# Patient Record
Sex: Male | Born: 1950
Health system: Southern US, Community
[De-identification: ages and names within clinical notes are randomized; demographics above are authoritative.]

## PROBLEM LIST (undated history)

## (undated) DIAGNOSIS — F32A Depression, unspecified: Secondary | ICD-10-CM

## (undated) DIAGNOSIS — F329 Major depressive disorder, single episode, unspecified: Secondary | ICD-10-CM

## (undated) DIAGNOSIS — W3400XA Accidental discharge from unspecified firearms or gun, initial encounter: Secondary | ICD-10-CM

## (undated) DIAGNOSIS — R351 Nocturia: Secondary | ICD-10-CM

## (undated) DIAGNOSIS — C61 Malignant neoplasm of prostate: Secondary | ICD-10-CM

## (undated) DIAGNOSIS — R011 Cardiac murmur, unspecified: Secondary | ICD-10-CM

## (undated) DIAGNOSIS — M1990 Unspecified osteoarthritis, unspecified site: Secondary | ICD-10-CM

## (undated) DIAGNOSIS — T884XXA Failed or difficult intubation, initial encounter: Secondary | ICD-10-CM

## (undated) DIAGNOSIS — I10 Essential (primary) hypertension: Secondary | ICD-10-CM

## (undated) DIAGNOSIS — F1011 Alcohol abuse, in remission: Secondary | ICD-10-CM

## (undated) DIAGNOSIS — F419 Anxiety disorder, unspecified: Secondary | ICD-10-CM

## (undated) HISTORY — PX: TOTAL HIP ARTHROPLASTY: SHX124

## (undated) HISTORY — PX: EYE SURGERY: SHX253

## (undated) HISTORY — PX: BREAST SURGERY: SHX581

## (undated) HISTORY — PX: PROSTATE BIOPSY: SHX241

## (undated) HISTORY — PX: ABDOMINAL SURGERY: SHX537

---
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
## 2015-07-13 ENCOUNTER — Encounter (HOSPITAL_COMMUNITY): Payer: Self-pay | Admitting: *Deleted

## 2015-07-13 ENCOUNTER — Emergency Department (HOSPITAL_COMMUNITY)
Admission: EM | Admit: 2015-07-13 | Discharge: 2015-07-13 | Disposition: A | Discharge status: Home / Self Care | Payer: Medicaid Other | Attending: Emergency Medicine | Admitting: Emergency Medicine

## 2015-07-13 DIAGNOSIS — Y92410 Unspecified street and highway as the place of occurrence of the external cause: Secondary | ICD-10-CM | POA: Diagnosis not present

## 2015-07-13 DIAGNOSIS — Y9389 Activity, other specified: Secondary | ICD-10-CM | POA: Diagnosis not present

## 2015-07-13 DIAGNOSIS — S4991XA Unspecified injury of right shoulder and upper arm, initial encounter: Secondary | ICD-10-CM | POA: Diagnosis present

## 2015-07-13 DIAGNOSIS — Z720 Tobacco use: Secondary | ICD-10-CM | POA: Diagnosis not present

## 2015-07-13 DIAGNOSIS — S40211A Abrasion of right shoulder, initial encounter: Secondary | ICD-10-CM | POA: Insufficient documentation

## 2015-07-13 DIAGNOSIS — M25511 Pain in right shoulder: Secondary | ICD-10-CM

## 2015-07-13 DIAGNOSIS — Y999 Unspecified external cause status: Secondary | ICD-10-CM | POA: Insufficient documentation

## 2015-07-13 HISTORY — DX: Unspecified osteoarthritis, unspecified site: M19.90

## 2015-07-13 MED ORDER — NAPROXEN 500 MG PO TABS: 500.0000 mg | ORAL_TABLET | Freq: Two times a day (BID) | ORAL | Status: DC

## 2015-07-13 MED ORDER — NAPROXEN 250 MG PO TABS
500.0000 mg | ORAL_TABLET | Freq: Once | ORAL | Status: AC
  Administered 2015-07-13: 500 mg via ORAL
  Filled 2015-07-13: qty 2

## 2015-07-13 MED ORDER — METHOCARBAMOL 500 MG PO TABS
500.0000 mg | ORAL_TABLET | Freq: Once | ORAL | Status: AC
  Administered 2015-07-13: 500 mg via ORAL
  Filled 2015-07-13: qty 1

## 2015-07-13 MED ORDER — METHOCARBAMOL 500 MG PO TABS: 500.0000 mg | ORAL_TABLET | Freq: Two times a day (BID) | ORAL | Status: DC

## 2015-07-13 NOTE — Discharge Instructions (Signed)
Shoulder Pain Take naproxen for pain. Follow up with your primary care physician. The shoulder is the joint that connects your arm to your body. Muscles and band-like tissues that connect bones to muscles (tendons) hold the joint together. Shoulder pain is felt if an injury or medical problem affects one or more parts of the shoulder. HOME CARE   Put ice on the sore area.  Put ice in a plastic bag.  Place a towel between your skin and the bag.  Leave the ice on for 15-20 minutes, 03-04 times a day for the first 2 days.  Stop using cold packs if they do not help with the pain.  If you were given something to keep your shoulder from moving (sling; shoulder immobilizer), wear it as told. Only take it off to shower or bathe.  Move your arm as little as possible, but keep your hand moving to prevent puffiness (swelling).  Squeeze a soft ball or foam pad as much as possible to help prevent swelling.  Take medicine as told by your doctor. GET HELP IF:  You have progressing new pain in your arm, hand, or fingers.  Your hand or fingers get cold.  Your medicine does not help lessen your pain. GET HELP RIGHT AWAY IF:   Your arm, hand, or fingers are numb or tingling.  Your arm, hand, or fingers are puffy (swollen), painful, or turn white or blue. MAKE SURE YOU:   Understand these instructions.  Will watch your condition.  Will get help right away if you are not doing well or get worse. Document Released: 05/03/2008 Document Revised: 04/01/2014 Document Reviewed: 05/29/2012 [facility] Patient Information 2015 [place], [place]. This information is not intended to replace advice given to you by your health care provider. Make sure you discuss any questions you have with your health care provider.
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
## 2015-07-13 NOTE — ED Notes (Signed)
Declined W/C at D/C and was escorted to lobby by RN. 

## 2015-08-25 ENCOUNTER — Emergency Department (HOSPITAL_COMMUNITY): Payer: Medicaid Other

## 2015-08-25 ENCOUNTER — Encounter (HOSPITAL_COMMUNITY): Payer: Self-pay | Admitting: Emergency Medicine

## 2015-08-25 ENCOUNTER — Inpatient Hospital Stay (HOSPITAL_COMMUNITY)
Admission: EM | Admit: 2015-08-25 | Discharge: 2015-08-29 | DRG: 184 | Disposition: A | Discharge status: Home / Self Care | Payer: Medicaid Other | Attending: General Surgery | Admitting: General Surgery

## 2015-08-25 DIAGNOSIS — F17210 Nicotine dependence, cigarettes, uncomplicated: Secondary | ICD-10-CM | POA: Diagnosis present

## 2015-08-25 DIAGNOSIS — F1910 Other psychoactive substance abuse, uncomplicated: Secondary | ICD-10-CM | POA: Diagnosis present

## 2015-08-25 DIAGNOSIS — L8990 Pressure ulcer of unspecified site, unspecified stage: Secondary | ICD-10-CM | POA: Insufficient documentation

## 2015-08-25 DIAGNOSIS — S42001A Fracture of unspecified part of right clavicle, initial encounter for closed fracture: Secondary | ICD-10-CM | POA: Diagnosis present

## 2015-08-25 DIAGNOSIS — J939 Pneumothorax, unspecified: Secondary | ICD-10-CM

## 2015-08-25 DIAGNOSIS — J9811 Atelectasis: Secondary | ICD-10-CM | POA: Diagnosis present

## 2015-08-25 DIAGNOSIS — R0902 Hypoxemia: Secondary | ICD-10-CM | POA: Diagnosis not present

## 2015-08-25 DIAGNOSIS — S2231XA Fracture of one rib, right side, initial encounter for closed fracture: Secondary | ICD-10-CM

## 2015-08-25 DIAGNOSIS — F10129 Alcohol abuse with intoxication, unspecified: Secondary | ICD-10-CM | POA: Diagnosis present

## 2015-08-25 DIAGNOSIS — D649 Anemia, unspecified: Secondary | ICD-10-CM | POA: Diagnosis present

## 2015-08-25 DIAGNOSIS — R413 Other amnesia: Secondary | ICD-10-CM | POA: Diagnosis present

## 2015-08-25 DIAGNOSIS — S42031A Displaced fracture of lateral end of right clavicle, initial encounter for closed fracture: Secondary | ICD-10-CM | POA: Diagnosis present

## 2015-08-25 DIAGNOSIS — R4020 Unspecified coma: Secondary | ICD-10-CM

## 2015-08-25 DIAGNOSIS — S2241XA Multiple fractures of ribs, right side, initial encounter for closed fracture: Principal | ICD-10-CM | POA: Diagnosis present

## 2015-08-25 DIAGNOSIS — S300XXA Contusion of lower back and pelvis, initial encounter: Secondary | ICD-10-CM | POA: Diagnosis present

## 2015-08-25 DIAGNOSIS — S27321A Contusion of lung, unilateral, initial encounter: Secondary | ICD-10-CM | POA: Diagnosis present

## 2015-08-25 DIAGNOSIS — D62 Acute posthemorrhagic anemia: Secondary | ICD-10-CM | POA: Clinically undetermined

## 2015-08-25 DIAGNOSIS — Z23 Encounter for immunization: Secondary | ICD-10-CM

## 2015-08-25 DIAGNOSIS — F10929 Alcohol use, unspecified with intoxication, unspecified: Secondary | ICD-10-CM

## 2015-08-25 DIAGNOSIS — S2249XA Multiple fractures of ribs, unspecified side, initial encounter for closed fracture: Secondary | ICD-10-CM | POA: Diagnosis present

## 2015-08-25 DIAGNOSIS — R404 Transient alteration of awareness: Secondary | ICD-10-CM | POA: Diagnosis present

## 2015-08-25 DIAGNOSIS — T1490XA Injury, unspecified, initial encounter: Secondary | ICD-10-CM

## 2015-08-25 HISTORY — DX: Anxiety disorder, unspecified: F41.9

## 2015-08-25 HISTORY — DX: Major depressive disorder, single episode, unspecified: F32.9

## 2015-08-25 HISTORY — DX: Depression, unspecified: F32.A

## 2015-08-25 LAB — CBC WITH DIFFERENTIAL/PLATELET
Basophils Absolute: 0 10*3/uL (ref 0.0–0.1)
Basophils Relative: 0 %
Eosinophils Absolute: 0 10*3/uL (ref 0.0–0.7)
Eosinophils Relative: 0 %
HCT: 37.5 % — ABNORMAL LOW (ref 39.0–52.0)
Hemoglobin: 11.8 g/dL — ABNORMAL LOW (ref 13.0–17.0)
Lymphocytes Relative: 12 %
Lymphs Abs: 1.1 10*3/uL (ref 0.7–4.0)
MCH: 24.8 pg — ABNORMAL LOW (ref 26.0–34.0)
MCHC: 31.5 g/dL (ref 30.0–36.0)
MCV: 78.8 fL (ref 78.0–100.0)
Monocytes Absolute: 0.8 10*3/uL (ref 0.1–1.0)
Monocytes Relative: 8 %
Neutro Abs: 7.6 10*3/uL (ref 1.7–7.7)
Neutrophils Relative %: 80 %
Platelets: 303 10*3/uL (ref 150–400)
RBC: 4.76 MIL/uL (ref 4.22–5.81)
RDW: 15.5 % (ref 11.5–15.5)
WBC: 9.6 10*3/uL (ref 4.0–10.5)

## 2015-08-25 LAB — COMPREHENSIVE METABOLIC PANEL
ALT: 19 U/L (ref 17–63)
AST: 46 U/L — ABNORMAL HIGH (ref 15–41)
Albumin: 3.4 g/dL — ABNORMAL LOW (ref 3.5–5.0)
Alkaline Phosphatase: 72 U/L (ref 38–126)
Anion gap: 9 (ref 5–15)
BUN: 9 mg/dL (ref 6–20)
CO2: 23 mmol/L (ref 22–32)
Calcium: 8.8 mg/dL — ABNORMAL LOW (ref 8.9–10.3)
Chloride: 106 mmol/L (ref 101–111)
Creatinine, Ser: 0.67 mg/dL (ref 0.61–1.24)
GFR calc Af Amer: >60 mL/min (ref >60)
GFR calc non Af Amer: >60 mL/min (ref >60)
Glucose, Bld: 82 mg/dL (ref 65–99)
Potassium: 3.5 mmol/L (ref 3.5–5.1)
Sodium: 138 mmol/L (ref 135–145)
Total Bilirubin: 0.3 mg/dL (ref 0.3–1.2)
Total Protein: 6.8 g/dL (ref 6.5–8.1)

## 2015-08-25 LAB — LIPASE, BLOOD: Lipase: 30 U/L (ref 22–51)

## 2015-08-25 LAB — I-STAT CG4 LACTIC ACID, ED
Lactic Acid, Venous: 2.56 mmol/L (ref 0.5–2.0)
Lactic Acid, Venous: 1.89 mmol/L (ref 0.5–2.0)

## 2015-08-25 LAB — ETHANOL: Alcohol, Ethyl (B): 128 mg/dL — ABNORMAL HIGH (ref <5)

## 2015-08-25 LAB — CK: Total CK: 500 U/L — ABNORMAL HIGH (ref 49–397)

## 2015-08-25 MED ORDER — ONDANSETRON HCL 4 MG/2ML IJ SOLN: 4.0000 mg | Freq: Four times a day (QID) | INTRAMUSCULAR | Status: DC | PRN

## 2015-08-25 MED ORDER — HYDROMORPHONE HCL 1 MG/ML IJ SOLN
0.5000 mg | INTRAMUSCULAR | Status: DC | PRN
Start: 2015-08-25 — End: 2015-08-26

## 2015-08-25 MED ORDER — OXYCODONE HCL 5 MG PO TABS
10.0000 mg | ORAL_TABLET | ORAL | Status: DC | PRN
  Administered 2015-08-26: 10 mg via ORAL
  Filled 2015-08-25: qty 2

## 2015-08-25 MED ORDER — OXYCODONE HCL 5 MG PO TABS: 2.5000 mg | ORAL_TABLET | ORAL | Status: DC | PRN

## 2015-08-25 MED ORDER — HYDROMORPHONE HCL 1 MG/ML IJ SOLN
1.0000 mg | Freq: Once | INTRAMUSCULAR | Status: AC
  Administered 2015-08-25: 1 mg via INTRAVENOUS
  Filled 2015-08-25: qty 1

## 2015-08-25 MED ORDER — IOHEXOL 300 MG/ML  SOLN
80.0000 mL | Freq: Once | INTRAMUSCULAR | Status: AC | PRN
  Administered 2015-08-25: 100 mL via INTRAVENOUS

## 2015-08-25 MED ORDER — HYDROMORPHONE HCL 1 MG/ML IJ SOLN: 1.0000 mg | INTRAMUSCULAR | Status: DC | PRN

## 2015-08-25 MED ORDER — INFLUENZA VAC SPLIT QUAD 0.5 ML IM SUSY
0.5000 mL | PREFILLED_SYRINGE | INTRAMUSCULAR | Status: AC
  Administered 2015-08-26: 0.5 mL via INTRAMUSCULAR
  Filled 2015-08-25: qty 0.5

## 2015-08-25 MED ORDER — ONDANSETRON HCL 4 MG PO TABS: 4.0000 mg | ORAL_TABLET | Freq: Four times a day (QID) | ORAL | Status: DC | PRN

## 2015-08-25 MED ORDER — OXYCODONE HCL 5 MG PO TABS: 5.0000 mg | ORAL_TABLET | ORAL | Status: DC | PRN

## 2015-08-25 MED ORDER — MORPHINE SULFATE (PF) 4 MG/ML IV SOLN: 4.0000 mg | Freq: Once | INTRAVENOUS | Status: DC

## 2015-08-25 MED ORDER — HYDROMORPHONE HCL 1 MG/ML IJ SOLN
1.0000 mg | INTRAMUSCULAR | Status: DC | PRN
  Administered 2015-08-25 – 2015-08-26 (×3): 1 mg via INTRAVENOUS
  Filled 2015-08-25 (×3): qty 1

## 2015-08-25 MED ORDER — TETANUS-DIPHTH-ACELL PERTUSSIS 5-2.5-18.5 LF-MCG/0.5 IM SUSP
0.5000 mL | Freq: Once | INTRAMUSCULAR | Status: AC
  Administered 2015-08-25: 0.5 mL via INTRAMUSCULAR
  Filled 2015-08-25: qty 0.5

## 2015-08-25 MED ORDER — POTASSIUM CHLORIDE IN NACL 20-0.9 MEQ/L-% IV SOLN
INTRAVENOUS | Status: DC
  Administered 2015-08-25: 23:00:00 via INTRAVENOUS
  Filled 2015-08-25: qty 1000

## 2015-08-25 MED ORDER — ENOXAPARIN SODIUM 40 MG/0.4ML ~~LOC~~ SOLN: 40.0000 mg | SUBCUTANEOUS | Status: DC

## 2015-08-25 MED ORDER — SODIUM CHLORIDE 0.9 % IV BOLUS (SEPSIS)
1000.0000 mL | Freq: Once | INTRAVENOUS | Status: AC
  Administered 2015-08-25: 1000 mL via INTRAVENOUS

## 2015-08-25 MED ORDER — PNEUMOCOCCAL VAC POLYVALENT 25 MCG/0.5ML IJ INJ
0.5000 mL | INJECTION | INTRAMUSCULAR | Status: AC
  Administered 2015-08-26: 0.5 mL via INTRAMUSCULAR
  Filled 2015-08-25: qty 0.5
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
## 2015-08-25 NOTE — ED Notes (Signed)
Pt st's unable to urinate at this time

## 2015-08-25 NOTE — ED Notes (Signed)
Dr. Blackman at bedside. 

## 2015-08-25 NOTE — ED Notes (Signed)
Pt returned from CT and x-ray.  Pt remains alert and oriented.  C/O right rib pain.

## 2015-08-25 NOTE — ED Notes (Signed)
Pt's 02 sats 88%-89%.  Pt placed on 02 at 2LPM via nasal cannula
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
## 2015-08-25 NOTE — ED Notes (Signed)
Pt made aware of bed assignment 
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
## 2015-08-26 ENCOUNTER — Inpatient Hospital Stay (HOSPITAL_COMMUNITY): Payer: Medicaid Other

## 2015-08-26 DIAGNOSIS — L8990 Pressure ulcer of unspecified site, unspecified stage: Secondary | ICD-10-CM | POA: Insufficient documentation

## 2015-08-26 DIAGNOSIS — S42001A Fracture of unspecified part of right clavicle, initial encounter for closed fracture: Secondary | ICD-10-CM | POA: Diagnosis present

## 2015-08-26 DIAGNOSIS — D649 Anemia, unspecified: Secondary | ICD-10-CM | POA: Diagnosis present

## 2015-08-26 DIAGNOSIS — D62 Acute posthemorrhagic anemia: Secondary | ICD-10-CM | POA: Clinically undetermined

## 2015-08-26 DIAGNOSIS — F1910 Other psychoactive substance abuse, uncomplicated: Secondary | ICD-10-CM | POA: Diagnosis present

## 2015-08-26 LAB — RAPID URINE DRUG SCREEN, HOSP PERFORMED
Amphetamines: NOT DETECTED
Barbiturates: NOT DETECTED
Benzodiazepines: NOT DETECTED
Cocaine: POSITIVE — AB
Opiates: POSITIVE — AB
Tetrahydrocannabinol: NOT DETECTED

## 2015-08-26 LAB — CBC
HCT: 33.8 % — ABNORMAL LOW (ref 39.0–52.0)
Hemoglobin: 10.9 g/dL — ABNORMAL LOW (ref 13.0–17.0)
MCH: 25.6 pg — ABNORMAL LOW (ref 26.0–34.0)
MCHC: 32.2 g/dL (ref 30.0–36.0)
MCV: 79.5 fL (ref 78.0–100.0)
Platelets: 283 10*3/uL (ref 150–400)
RBC: 4.25 MIL/uL (ref 4.22–5.81)
RDW: 15.9 % — ABNORMAL HIGH (ref 11.5–15.5)
WBC: 8.3 10*3/uL (ref 4.0–10.5)

## 2015-08-26 LAB — BASIC METABOLIC PANEL
Anion gap: 10 (ref 5–15)
BUN: 10 mg/dL (ref 6–20)
CO2: 22 mmol/L (ref 22–32)
Calcium: 8.5 mg/dL — ABNORMAL LOW (ref 8.9–10.3)
Chloride: 105 mmol/L (ref 101–111)
Creatinine, Ser: 0.7 mg/dL (ref 0.61–1.24)
GFR calc Af Amer: >60 mL/min (ref >60)
GFR calc non Af Amer: >60 mL/min (ref >60)
Glucose, Bld: 83 mg/dL (ref 65–99)
Potassium: 4 mmol/L (ref 3.5–5.1)
Sodium: 137 mmol/L (ref 135–145)

## 2015-08-26 LAB — URINALYSIS, ROUTINE W REFLEX MICROSCOPIC
Bilirubin Urine: NEGATIVE
Glucose, UA: NEGATIVE mg/dL
Hgb urine dipstick: NEGATIVE
Ketones, ur: 15 mg/dL — AB
Nitrite: NEGATIVE
Protein, ur: NEGATIVE mg/dL
Specific Gravity, Urine: 1.02 (ref 1.005–1.030)
Urobilinogen, UA: 0.2 mg/dL (ref 0.0–1.0)
pH: 5.5 (ref 5.0–8.0)

## 2015-08-26 LAB — URINE MICROSCOPIC-ADD ON

## 2015-08-26 LAB — MRSA PCR SCREENING: MRSA by PCR: NEGATIVE

## 2015-08-26 MED ORDER — POLYETHYLENE GLYCOL 3350 17 G PO PACK
17.0000 g | PACK | Freq: Every day | ORAL | Status: DC
  Administered 2015-08-26 – 2015-08-29 (×4): 17 g via ORAL
  Filled 2015-08-26 (×4): qty 1

## 2015-08-26 MED ORDER — DOCUSATE SODIUM 100 MG PO CAPS
100.0000 mg | ORAL_CAPSULE | Freq: Two times a day (BID) | ORAL | Status: DC
  Administered 2015-08-26 – 2015-08-29 (×7): 100 mg via ORAL
  Filled 2015-08-26 (×7): qty 1

## 2015-08-26 MED ORDER — NAPROXEN 250 MG PO TABS
500.0000 mg | ORAL_TABLET | Freq: Two times a day (BID) | ORAL | Status: DC
  Administered 2015-08-26 – 2015-08-29 (×7): 500 mg via ORAL
  Filled 2015-08-26 (×9): qty 2

## 2015-08-26 MED ORDER — OXYCODONE HCL 5 MG PO TABS
5.0000 mg | ORAL_TABLET | ORAL | Status: DC | PRN
  Administered 2015-08-26: 10 mg via ORAL
  Administered 2015-08-26 – 2015-08-28 (×7): 15 mg via ORAL
  Filled 2015-08-26 (×2): qty 3
  Filled 2015-08-26: qty 2
  Filled 2015-08-26 (×5): qty 3

## 2015-08-26 MED ORDER — ENOXAPARIN SODIUM 30 MG/0.3ML ~~LOC~~ SOLN
30.0000 mg | Freq: Two times a day (BID) | SUBCUTANEOUS | Status: DC
  Administered 2015-08-26 – 2015-08-29 (×7): 30 mg via SUBCUTANEOUS
  Filled 2015-08-26 (×7): qty 0.3

## 2015-08-26 NOTE — Progress Notes (Signed)
Patient ID: Michael Rosales, male   DOB: 08/08/1951, 64 y.o.   MRN: [id number]   LOS: 1 day   Subjective: Feeling all right.   Objective: Vital signs in last 24 hours: Temp:  [98.6 F (37 C)-99 F (37.2 C)] 98.6 F (37 C) (09/27 0400) Pulse Rate:  [62-86] 62 (09/27 0600) Resp:  [11-24] 11 (09/27 0600) BP: (90-132)/(54-77) 125/75 mmHg (09/27 0600) SpO2:  [85 %-99 %] 96 % (09/27 0600) FiO2 (%):  [50 %] 50 % (09/27 0313) Weight:  [90.719 kg (200 lb)-108.9 kg (240 lb 1.3 oz)] 108.9 kg (240 lb 1.3 oz) (09/26 2220)    IS: 1299ml   Laboratory  CBC  Recent Labs  08/25/15 2013 08/26/15 0259  WBC 9.6 8.3  HGB 11.8* 10.9*  HCT 37.5* 33.8*  PLT 303 283   BMET  Recent Labs  08/25/15 2013 08/26/15 0259  NA 138 137  K 3.5 4.0  CL 106 105  CO2 23 22  GLUCOSE 82 83  BUN 9 10  CREATININE 0.67 0.70  CALCIUM 8.8* 8.5*    Radiology Results PORTABLE CHEST 1 VIEW  COMPARISON: Radiographs and CT performed yesterday.  FINDINGS: Progressive volume loss in the right lower hemithorax with increased atelectasis and ill-defined basilar opacity. No definite pneumothorax. Cardiomediastinal contours are unchanged. Minimal linear atelectasis at the left lung base. No large pleural effusion. No pulmonary edema. Multiple right-sided rib fractures are again seen. Fracture with fragmentation of the distal right clavicle is unchanged.  IMPRESSION: Increased volume loss in the right lower hemithorax with increased atelectasis and ill-defined basilar opacity, likely increased contusion. No evidence of pneumothorax. Multiple right-sided rib fractures again seen.   Electronically Signed  By: Jeb Levering M.D.  On: 08/26/2015 03:42   Physical Exam General appearance: alert and no distress Resp: clear to auscultation bilaterally Cardio: regular rate and rhythm GI: normal findings: bowel sounds normal and soft, non-tender   Assessment/Plan: BCC Right clavicle fx --  Awaiting ortho consult by Dr. Lorin Mercy Multiple right rib fxs -- Pulmonary toilet Acute on chronic anemia -- Will need OP f/u PSA -- Drinks 2-3x/week FEN -- Add NSAID, increase OxyIR range, advance diet, SL IV, bowel regimen VTE -- SCD's, Lovenox (increase for weight) Dispo -- To floor, PT/OT    Lisette Abu, PA-C Pager: [phone] General Trauma PA Pager: [phone]  08/26/2015

## 2015-08-26 NOTE — Progress Notes (Signed)
SATURATION QUALIFICATIONS: (This note is used to comply with regulatory documentation for home oxygen)  Patient Saturations on Room Air at Rest = 90-91%  Patient Saturations on Room Air while Ambulating = 83-84%  Patient Saturations on 1 Liters of oxygen while at rest after ambulating = 93-94%  Please briefly explain why patient needs home oxygen: Patient requires oxygen at this time for safe ambulation to maintain sats >90%.  Roanna Epley, SPT [phone] I have read, reviewed and agree with student's note.   [place] [phone] (pager)

## 2015-08-26 NOTE — Progress Notes (Signed)
Orthopedic Tech Progress Note Patient Details:  Michael Rosales 12-03-1950 [id number]  Ortho Devices Type of Ortho Device: Arm sling Ortho Device/Splint Location: rue Ortho Device/Splint Interventions: Application   Nykerria Macconnell 08/26/2015, 8:20 AM

## 2015-08-26 NOTE — Progress Notes (Signed)
Utilization Review Completed.Remijio Holleran T9/27/2016  

## 2015-08-26 NOTE — Progress Notes (Signed)
Pt c/o SOB. States 'I can't catch my breath.' Originally on 2L. Increased to 4L [place]. 96% and RR 17-24. No accessory muscle use. States pain is 8/10. Prn dilaudid given. Pt still feels like he is not getting any oxygen. Placed on a venti mask and sats 99%, RR 24. CXR ordered for 6am. Radiology coming to perform stat. Will notify MD when resulted.   M.Forest Gleason, RN
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
## 2015-08-26 NOTE — Progress Notes (Signed)
Attempted to give report, receiving nurse is currently in the huddle.

## 2015-08-26 NOTE — Progress Notes (Signed)
Transferred to 6N per wheelchair. Pt is alert and oriented. Sling applied to right arm by Ortho Tech.

## 2015-08-26 NOTE — Progress Notes (Signed)
Patient resting comfortably at this time. 99% on venti mask. RR 17. CXR resulted and MD notified.   M.Forest Gleason, RN
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
## 2015-08-27 NOTE — Progress Notes (Signed)
Patient ID: Michael Rosales, male   DOB: 31-Jul-1951, 64 y.o.   MRN: [id number]   LOS: 2 days   Subjective: Sore this morning but otherwise good. Was hypoxic (83%) yesterday when up with PT and was symptomatic.   Objective: Vital signs in last 24 hours: Temp:  [98.1 F (36.7 C)-98.8 F (37.1 C)] 98.1 F (36.7 C) (09/28 0549) Pulse Rate:  [53-60] 58 (09/28 0549) Resp:  [17-18] 18 (09/28 0549) BP: (102-127)/(57-74) 102/63 mmHg (09/28 0549) SpO2:  [93 %-100 %] 100 % (09/28 0549) Last BM Date: 08/25/15   IS: 1221ml (=)   Physical Exam General appearance: alert and no distress Resp: clear to auscultation bilaterally Cardio: regular rate and rhythm GI: normal findings: bowel sounds normal and soft, non-tender Extremities: NVI   Assessment/Plan: BCC Right clavicle fx -- Nonop in sling per Dr. Lorin Mercy Multiple right rib fxs -- Pulmonary toilet, will need to get oxygenation better before discharge vs pulmonary consult and home with home O2 but I don't think he'll need that. Acute on chronic anemia -- Will need OP f/u PSA -- Drinks 2-3x/week FEN -- No issues VTE -- SCD's, Lovenox  Dispo -- PT/OT    Lisette Abu, PA-C Pager: [phone] General Trauma PA Pager: [phone]  08/27/2015
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
## 2015-08-28 NOTE — Progress Notes (Signed)
SATURATION QUALIFICATIONS: (This note is used to comply with regulatory documentation for home oxygen)  Patient Saturations on Room Air at Rest = 96%  Patient Saturations on Room Air while Ambulating = 87%  Patient Saturations on 2 Liters of oxygen while Ambulating = 90%  Please briefly explain why patient needs home oxygen: Currently requires supplemental oxygen to maintain SpO2 >88% while ambulating.

## 2015-08-29 ENCOUNTER — Encounter (HOSPITAL_COMMUNITY): Payer: Self-pay

## 2015-08-29 MED ORDER — OXYCODONE-ACETAMINOPHEN 7.5-325 MG PO TABS: 1.0000 | ORAL_TABLET | ORAL | Status: DC | PRN

## 2015-08-29 MED ORDER — NAPROXEN 500 MG PO TABS: 500.0000 mg | ORAL_TABLET | Freq: Two times a day (BID) | ORAL | Status: DC

## 2015-08-29 NOTE — Progress Notes (Signed)
Pt scheduled for discharge this pm. Bus pass provided by case management. Pt getting dressed for discharge
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
## 2015-08-29 NOTE — Care Management Note (Signed)
Case Management Note  Patient Details  Name: Michael Rosales MRN: [id number] Date of Birth: [birth year]  Subjective/Objective:                    Action/Plan:  Explained MATCH letter to patient 14 days pain medication $3 . Bus pass given . Patient stated he understands  Expected Discharge Date:                  Expected Discharge Plan:  Home/Self Care  In-House Referral:     Discharge planning Services  CM Consult, Wake Program, Medication Assistance  Post Acute Care Choice:    Choice offered to:     DME Arranged:    DME Agency:     HH Arranged:    HH Agency:     Status of Service:  Completed, signed off  Medicare Important Message Given:    Date Medicare IM Given:    Medicare IM give by:    Date Additional Medicare IM Given:    Additional Medicare Important Message give by:     If discussed at [place] of Stay Meetings, dates discussed:    Additional Comments:  [name], RN 08/29/2015, 10:48 AM
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
## 2015-08-29 NOTE — Progress Notes (Signed)
Pt states that he last had a bowel movement 4 days ago despite being on daily miralax and colace 100mg  po twice a day

## 2015-08-29 NOTE — Progress Notes (Signed)
Pt discharged home in stable condition. Discharge instructions provided with pt verbalizing understanding the instructions. No issues of concern voiced

## 2015-11-16 ENCOUNTER — Encounter (HOSPITAL_COMMUNITY): Payer: Self-pay | Admitting: Emergency Medicine

## 2015-11-16 ENCOUNTER — Emergency Department (HOSPITAL_COMMUNITY): Payer: Medicaid Other

## 2015-11-16 ENCOUNTER — Emergency Department (HOSPITAL_COMMUNITY)
Admission: EM | Admit: 2015-11-16 | Discharge: 2015-11-16 | Disposition: A | Discharge status: Home / Self Care | Payer: Medicaid Other | Attending: Emergency Medicine | Admitting: Emergency Medicine

## 2015-11-16 DIAGNOSIS — S29001A Unspecified injury of muscle and tendon of front wall of thorax, initial encounter: Secondary | ICD-10-CM | POA: Diagnosis not present

## 2015-11-16 DIAGNOSIS — S0990XA Unspecified injury of head, initial encounter: Secondary | ICD-10-CM | POA: Insufficient documentation

## 2015-11-16 DIAGNOSIS — M1990 Unspecified osteoarthritis, unspecified site: Secondary | ICD-10-CM | POA: Diagnosis not present

## 2015-11-16 DIAGNOSIS — Z8659 Personal history of other mental and behavioral disorders: Secondary | ICD-10-CM | POA: Insufficient documentation

## 2015-11-16 DIAGNOSIS — Y9355 Activity, bike riding: Secondary | ICD-10-CM | POA: Insufficient documentation

## 2015-11-16 DIAGNOSIS — F17210 Nicotine dependence, cigarettes, uncomplicated: Secondary | ICD-10-CM | POA: Diagnosis not present

## 2015-11-16 DIAGNOSIS — S299XXA Unspecified injury of thorax, initial encounter: Secondary | ICD-10-CM

## 2015-11-16 DIAGNOSIS — Z791 Long term (current) use of non-steroidal anti-inflammatories (NSAID): Secondary | ICD-10-CM | POA: Diagnosis not present

## 2015-11-16 DIAGNOSIS — Y92410 Unspecified street and highway as the place of occurrence of the external cause: Secondary | ICD-10-CM | POA: Diagnosis not present

## 2015-11-16 DIAGNOSIS — Y998 Other external cause status: Secondary | ICD-10-CM | POA: Insufficient documentation

## 2015-11-16 MED ORDER — TRAMADOL HCL 50 MG PO TABS: 50.0000 mg | ORAL_TABLET | Freq: Four times a day (QID) | ORAL | Status: DC | PRN

## 2015-11-16 MED ORDER — NAPROXEN 500 MG PO TABS: 500.0000 mg | ORAL_TABLET | Freq: Two times a day (BID) | ORAL | Status: DC

## 2015-11-16 MED ORDER — ACETAMINOPHEN 325 MG PO TABS
650.0000 mg | ORAL_TABLET | Freq: Once | ORAL | Status: AC
Start: 2015-11-16 — End: 2015-11-16
  Administered 2015-11-16: 650 mg via ORAL
  Filled 2015-11-16: qty 2

## 2015-11-16 NOTE — ED Notes (Signed)
Declined W/C at D/C and was escorted to lobby by RN. 
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
## 2015-11-25 ENCOUNTER — Emergency Department (HOSPITAL_COMMUNITY)
Admission: EM | Admit: 2015-11-25 | Discharge: 2015-11-25 | Discharge status: Against Medical Advice | Payer: Medicaid Other | Attending: Emergency Medicine | Admitting: Emergency Medicine

## 2015-11-25 ENCOUNTER — Encounter (HOSPITAL_COMMUNITY): Payer: Self-pay | Admitting: Vascular Surgery

## 2015-11-25 DIAGNOSIS — Z87828 Personal history of other (healed) physical injury and trauma: Secondary | ICD-10-CM | POA: Diagnosis not present

## 2015-11-25 DIAGNOSIS — F329 Major depressive disorder, single episode, unspecified: Secondary | ICD-10-CM | POA: Insufficient documentation

## 2015-11-25 DIAGNOSIS — F419 Anxiety disorder, unspecified: Secondary | ICD-10-CM | POA: Diagnosis not present

## 2015-11-25 DIAGNOSIS — Z791 Long term (current) use of non-steroidal anti-inflammatories (NSAID): Secondary | ICD-10-CM | POA: Insufficient documentation

## 2015-11-25 DIAGNOSIS — M1990 Unspecified osteoarthritis, unspecified site: Secondary | ICD-10-CM | POA: Diagnosis not present

## 2015-11-25 DIAGNOSIS — F17210 Nicotine dependence, cigarettes, uncomplicated: Secondary | ICD-10-CM | POA: Insufficient documentation

## 2015-11-25 DIAGNOSIS — Z760 Encounter for issue of repeat prescription: Secondary | ICD-10-CM | POA: Diagnosis present
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
## 2015-11-25 NOTE — ED Notes (Signed)
Patient told PA that he was "he was getting paranoid and he had to go".   Patient left.

## 2015-11-25 NOTE — ED Notes (Signed)
See PA assessment.   Patient basically told the PA he just needs medication refill and that [facility] sent him to the ED.

## 2015-11-25 NOTE — ED Notes (Signed)
Pt reports to the ED for eval of medication refill. Pt states he is out of his Gabapentin, Trazodone, and Buspar x 2 days. Pt states he gets it from [facility] and that he needs enough to last him until 1*/10. Pt A&OX4, resp e/u, and skin warm and dry.

## 2017-06-29 ENCOUNTER — Encounter (HOSPITAL_COMMUNITY): Payer: Self-pay

## 2017-06-29 ENCOUNTER — Emergency Department (HOSPITAL_COMMUNITY): Payer: Medicaid Other

## 2017-06-29 ENCOUNTER — Emergency Department (HOSPITAL_COMMUNITY)
Admission: EM | Admit: 2017-06-29 | Discharge: 2017-06-30 | Disposition: A | Discharge status: Home / Self Care | Payer: Medicaid Other | Attending: Emergency Medicine | Admitting: Emergency Medicine

## 2017-06-29 DIAGNOSIS — R1084 Generalized abdominal pain: Secondary | ICD-10-CM | POA: Insufficient documentation

## 2017-06-29 DIAGNOSIS — R4182 Altered mental status, unspecified: Secondary | ICD-10-CM | POA: Insufficient documentation

## 2017-06-29 DIAGNOSIS — Y9389 Activity, other specified: Secondary | ICD-10-CM | POA: Diagnosis not present

## 2017-06-29 DIAGNOSIS — S40812A Abrasion of left upper arm, initial encounter: Secondary | ICD-10-CM | POA: Insufficient documentation

## 2017-06-29 DIAGNOSIS — S80811A Abrasion, right lower leg, initial encounter: Secondary | ICD-10-CM | POA: Diagnosis not present

## 2017-06-29 DIAGNOSIS — Y929 Unspecified place or not applicable: Secondary | ICD-10-CM | POA: Insufficient documentation

## 2017-06-29 DIAGNOSIS — Y999 Unspecified external cause status: Secondary | ICD-10-CM | POA: Diagnosis not present

## 2017-06-29 DIAGNOSIS — F10920 Alcohol use, unspecified with intoxication, uncomplicated: Secondary | ICD-10-CM | POA: Insufficient documentation

## 2017-06-29 DIAGNOSIS — T148XXA Other injury of unspecified body region, initial encounter: Secondary | ICD-10-CM

## 2017-06-29 DIAGNOSIS — W19XXXA Unspecified fall, initial encounter: Secondary | ICD-10-CM

## 2017-06-29 DIAGNOSIS — S80812A Abrasion, left lower leg, initial encounter: Secondary | ICD-10-CM | POA: Diagnosis not present

## 2017-06-29 DIAGNOSIS — R55 Syncope and collapse: Secondary | ICD-10-CM | POA: Diagnosis present

## 2017-06-29 LAB — URINALYSIS, ROUTINE W REFLEX MICROSCOPIC
Bacteria, UA: NONE SEEN
Bilirubin Urine: NEGATIVE
Glucose, UA: NEGATIVE mg/dL
Ketones, ur: NEGATIVE mg/dL
Leukocytes, UA: NEGATIVE
Nitrite: NEGATIVE
Protein, ur: NEGATIVE mg/dL
Specific Gravity, Urine: 1.011 (ref 1.005–1.030)
Squamous Epithelial / LPF: NONE SEEN
pH: 5 (ref 5.0–8.0)

## 2017-06-29 LAB — COMPREHENSIVE METABOLIC PANEL
ALT: 13 U/L — ABNORMAL LOW (ref 17–63)
AST: 26 U/L (ref 15–41)
Albumin: 3.5 g/dL (ref 3.5–5.0)
Alkaline Phosphatase: 56 U/L (ref 38–126)
Anion gap: 9 (ref 5–15)
BUN: 11 mg/dL (ref 6–20)
CO2: 20 mmol/L — ABNORMAL LOW (ref 22–32)
Calcium: 8.9 mg/dL (ref 8.9–10.3)
Chloride: 108 mmol/L (ref 101–111)
Creatinine, Ser: 1.13 mg/dL (ref 0.61–1.24)
GFR calc Af Amer: >60 mL/min (ref >60)
GFR calc non Af Amer: >60 mL/min (ref >60)
Glucose, Bld: 104 mg/dL — ABNORMAL HIGH (ref 65–99)
Potassium: 3.4 mmol/L — ABNORMAL LOW (ref 3.5–5.1)
Sodium: 137 mmol/L (ref 135–145)
Total Bilirubin: 0.5 mg/dL (ref 0.3–1.2)
Total Protein: 6.4 g/dL — ABNORMAL LOW (ref 6.5–8.1)

## 2017-06-29 LAB — I-STAT CG4 LACTIC ACID, ED: Lactic Acid, Venous: 2.37 mmol/L (ref 0.5–1.9)

## 2017-06-29 LAB — CBC
HCT: 34.6 % — ABNORMAL LOW (ref 39.0–52.0)
Hemoglobin: 11.2 g/dL — ABNORMAL LOW (ref 13.0–17.0)
MCH: 23.1 pg — ABNORMAL LOW (ref 26.0–34.0)
MCHC: 32.4 g/dL (ref 30.0–36.0)
MCV: 71.5 fL — ABNORMAL LOW (ref 78.0–100.0)
Platelets: 312 10*3/uL (ref 150–400)
RBC: 4.84 MIL/uL (ref 4.22–5.81)
RDW: 16 % — ABNORMAL HIGH (ref 11.5–15.5)
WBC: 6.2 10*3/uL (ref 4.0–10.5)

## 2017-06-29 LAB — SAMPLE TO BLOOD BANK

## 2017-06-29 LAB — I-STAT CHEM 8, ED
BUN: 13 mg/dL (ref 6–20)
Calcium, Ion: 1.16 mmol/L (ref 1.15–1.40)
Chloride: 106 mmol/L (ref 101–111)
Creatinine, Ser: 1.4 mg/dL — ABNORMAL HIGH (ref 0.61–1.24)
Glucose, Bld: 101 mg/dL — ABNORMAL HIGH (ref 65–99)
HCT: 38 % — ABNORMAL LOW (ref 39.0–52.0)
Hemoglobin: 12.9 g/dL — ABNORMAL LOW (ref 13.0–17.0)
Potassium: 3.4 mmol/L — ABNORMAL LOW (ref 3.5–5.1)
Sodium: 138 mmol/L (ref 135–145)
TCO2: 19 mmol/L (ref 0–100)

## 2017-06-29 LAB — ETHANOL: Alcohol, Ethyl (B): 273 mg/dL — ABNORMAL HIGH (ref <5)

## 2017-06-29 LAB — PROTIME-INR
INR: 1.01
Prothrombin Time: 13.3 seconds (ref 11.4–15.2)

## 2017-06-29 MED ORDER — IOPAMIDOL (ISOVUE-300) INJECTION 61%
INTRAVENOUS | Status: AC
  Administered 2017-06-29: 100 mL
  Filled 2017-06-29: qty 100
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
## 2017-06-29 NOTE — ED Notes (Signed)
Vital signs stable. 
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
## 2017-06-30 ENCOUNTER — Encounter (HOSPITAL_COMMUNITY): Payer: Self-pay | Admitting: Vascular Surgery

## 2017-06-30 MED ORDER — IBUPROFEN 800 MG PO TABS
800.0000 mg | ORAL_TABLET | Freq: Once | ORAL | Status: AC
  Administered 2017-06-30: 800 mg via ORAL
  Filled 2017-06-30: qty 1

## 2017-06-30 NOTE — ED Notes (Signed)
Attempted to walk patient unable to walk.

## 2017-06-30 NOTE — ED Provider Notes (Signed)
  Physical Exam  BP 109/71 (BP Location: Right Arm)   Pulse 66   Temp 98.3 F (36.8 C) (Oral)   Resp 20   Ht 5\' 10"  (1.778 m)   Wt 74.8 kg (165 lb)   SpO2 95%   BMI 23.68 kg/m   Physical Exam  ED Course  Procedures  MDM  Patient is clinically sober. He is talking coherently, gait is normal (he has arthritis, but walked for [place] w/o needing assistance), and is demonstrating rational thought process. We shall discharge him shortly, and we have discussed the warning signs of alcohol withdrawal with him verbally, and the information will be provided with the discharge instructions as well.        Varney Biles, MD 06/30/17 [phone]

## 2017-06-30 NOTE — ED Notes (Signed)
Ambulated patient with assistance of RN and MD .

## 2017-06-30 NOTE — ED Notes (Signed)
Ambulated pt in hallway, pt unsteady on feet, pt buckling at the knees, states he needs his ibuprofen due to his arthritis. MD aware, meal given.

## 2018-01-01 ENCOUNTER — Emergency Department (HOSPITAL_COMMUNITY): Payer: Medicaid Other

## 2018-01-01 ENCOUNTER — Inpatient Hospital Stay (HOSPITAL_COMMUNITY)
Admission: EM | Admit: 2018-01-01 | Discharge: 2018-01-04 | DRG: 563 | Disposition: A | Discharge status: Home / Self Care | Payer: Medicaid Other | Attending: General Surgery | Admitting: General Surgery

## 2018-01-01 ENCOUNTER — Other Ambulatory Visit: Payer: Self-pay

## 2018-01-01 ENCOUNTER — Encounter (HOSPITAL_COMMUNITY): Payer: Self-pay | Admitting: Neurological Surgery

## 2018-01-01 DIAGNOSIS — F10129 Alcohol abuse with intoxication, unspecified: Secondary | ICD-10-CM | POA: Diagnosis present

## 2018-01-01 DIAGNOSIS — M25461 Effusion, right knee: Secondary | ICD-10-CM | POA: Diagnosis present

## 2018-01-01 DIAGNOSIS — S61011A Laceration without foreign body of right thumb without damage to nail, initial encounter: Secondary | ICD-10-CM | POA: Diagnosis present

## 2018-01-01 DIAGNOSIS — Y907 Blood alcohol level of 200-239 mg/100 ml: Secondary | ICD-10-CM | POA: Diagnosis present

## 2018-01-01 DIAGNOSIS — S62521A Displaced fracture of distal phalanx of right thumb, initial encounter for closed fracture: Principal | ICD-10-CM | POA: Diagnosis present

## 2018-01-01 DIAGNOSIS — Y9355 Activity, bike riding: Secondary | ICD-10-CM

## 2018-01-01 DIAGNOSIS — S129XXA Fracture of neck, unspecified, initial encounter: Secondary | ICD-10-CM | POA: Diagnosis present

## 2018-01-01 DIAGNOSIS — S12600A Unspecified displaced fracture of seventh cervical vertebra, initial encounter for closed fracture: Secondary | ICD-10-CM | POA: Diagnosis present

## 2018-01-01 LAB — I-STAT CHEM 8, ED
BUN: 12 mg/dL (ref 6–20)
Calcium, Ion: 1.19 mmol/L (ref 1.15–1.40)
Chloride: 109 mmol/L (ref 101–111)
Creatinine, Ser: 1 mg/dL (ref 0.61–1.24)
Glucose, Bld: 91 mg/dL (ref 65–99)
HCT: 36 % — ABNORMAL LOW (ref 39.0–52.0)
Hemoglobin: 12.2 g/dL — ABNORMAL LOW (ref 13.0–17.0)
Potassium: 3.6 mmol/L (ref 3.5–5.1)
Sodium: 143 mmol/L (ref 135–145)
TCO2: 21 mmol/L — ABNORMAL LOW (ref 22–32)

## 2018-01-01 LAB — CBC WITH DIFFERENTIAL/PLATELET
Basophils Absolute: 0 10*3/uL (ref 0.0–0.1)
Basophils Relative: 0 %
Eosinophils Absolute: 0 10*3/uL (ref 0.0–0.7)
Eosinophils Relative: 0 %
HCT: 33.6 % — ABNORMAL LOW (ref 39.0–52.0)
Hemoglobin: 10.7 g/dL — ABNORMAL LOW (ref 13.0–17.0)
Lymphocytes Relative: 45 %
Lymphs Abs: 2.1 10*3/uL (ref 0.7–4.0)
MCH: 23.5 pg — ABNORMAL LOW (ref 26.0–34.0)
MCHC: 31.8 g/dL (ref 30.0–36.0)
MCV: 73.7 fL — ABNORMAL LOW (ref 78.0–100.0)
Monocytes Absolute: 0.4 10*3/uL (ref 0.1–1.0)
Monocytes Relative: 8 %
Neutro Abs: 2.2 10*3/uL (ref 1.7–7.7)
Neutrophils Relative %: 47 %
Platelets: 328 10*3/uL (ref 150–400)
RBC: 4.56 MIL/uL (ref 4.22–5.81)
RDW: 16.8 % — ABNORMAL HIGH (ref 11.5–15.5)
WBC: 4.6 10*3/uL (ref 4.0–10.5)

## 2018-01-01 LAB — COMPREHENSIVE METABOLIC PANEL
ALT: 14 U/L — ABNORMAL LOW (ref 17–63)
AST: 29 U/L (ref 15–41)
Albumin: 3.6 g/dL (ref 3.5–5.0)
Alkaline Phosphatase: 52 U/L (ref 38–126)
Anion gap: 10 (ref 5–15)
BUN: 12 mg/dL (ref 6–20)
CO2: 20 mmol/L — ABNORMAL LOW (ref 22–32)
Calcium: 9 mg/dL (ref 8.9–10.3)
Chloride: 111 mmol/L (ref 101–111)
Creatinine, Ser: 0.9 mg/dL (ref 0.61–1.24)
GFR calc Af Amer: >60 mL/min (ref >60)
GFR calc non Af Amer: >60 mL/min (ref >60)
Glucose, Bld: 96 mg/dL (ref 65–99)
Potassium: 3.7 mmol/L (ref 3.5–5.1)
Sodium: 141 mmol/L (ref 135–145)
Total Bilirubin: 0.4 mg/dL (ref 0.3–1.2)
Total Protein: 6.7 g/dL (ref 6.5–8.1)

## 2018-01-01 LAB — I-STAT CG4 LACTIC ACID, ED: Lactic Acid, Venous: 3.27 mmol/L (ref 0.5–1.9)

## 2018-01-01 LAB — ABO/RH: ABO/RH(D): O POS

## 2018-01-01 LAB — TYPE AND SCREEN
ABO/RH(D): O POS
Antibody Screen: NEGATIVE

## 2018-01-01 LAB — PROTIME-INR
INR: 0.98
Prothrombin Time: 12.9 seconds (ref 11.4–15.2)

## 2018-01-01 LAB — ETHANOL: Alcohol, Ethyl (B): 224 mg/dL — ABNORMAL HIGH (ref <10)

## 2018-01-01 MED ORDER — FENTANYL CITRATE (PF) 100 MCG/2ML IJ SOLN
50.0000 ug | Freq: Once | INTRAMUSCULAR | Status: AC
  Administered 2018-01-01: 50 ug via INTRAVENOUS
  Filled 2018-01-01: qty 2

## 2018-01-01 MED ORDER — TETANUS-DIPHTH-ACELL PERTUSSIS 5-2.5-18.5 LF-MCG/0.5 IM SUSP
0.5000 mL | Freq: Once | INTRAMUSCULAR | Status: AC
  Administered 2018-01-01: 0.5 mL via INTRAMUSCULAR
  Filled 2018-01-01: qty 0.5

## 2018-01-01 MED ORDER — SODIUM CHLORIDE 0.9 % IV BOLUS (SEPSIS)
1000.0000 mL | Freq: Once | INTRAVENOUS | Status: AC
  Administered 2018-01-01: 1000 mL via INTRAVENOUS

## 2018-01-01 MED ORDER — CEFAZOLIN SODIUM-DEXTROSE 1-4 GM/50ML-% IV SOLN
1.0000 g | Freq: Once | INTRAVENOUS | Status: AC
  Administered 2018-01-02: 1 g via INTRAVENOUS
  Filled 2018-01-01: qty 50

## 2018-01-01 MED ORDER — FENTANYL CITRATE (PF) 100 MCG/2ML IJ SOLN
50.0000 ug | Freq: Once | INTRAMUSCULAR | Status: AC
  Administered 2018-01-02: 50 ug via INTRAVENOUS
  Filled 2018-01-01: qty 2

## 2018-01-01 MED ORDER — LIDOCAINE HCL (PF) 1 % IJ SOLN
30.0000 mL | Freq: Once | INTRAMUSCULAR | Status: AC
  Administered 2018-01-01: 30 mL
  Filled 2018-01-01: qty 30

## 2018-01-01 MED ORDER — IOPAMIDOL (ISOVUE-300) INJECTION 61%
INTRAVENOUS | Status: AC
  Administered 2018-01-01: 100 mL
  Filled 2018-01-01: qty 100
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
## 2018-01-01 NOTE — ED Notes (Signed)
Dr Ellender Hose given a copy of lactic acid 3.27

## 2018-01-02 ENCOUNTER — Observation Stay (HOSPITAL_COMMUNITY): Payer: Medicaid Other

## 2018-01-02 DIAGNOSIS — Y9355 Activity, bike riding: Secondary | ICD-10-CM | POA: Diagnosis not present

## 2018-01-02 DIAGNOSIS — S61011A Laceration without foreign body of right thumb without damage to nail, initial encounter: Secondary | ICD-10-CM | POA: Diagnosis present

## 2018-01-02 DIAGNOSIS — F10120 Alcohol abuse with intoxication, uncomplicated: Secondary | ICD-10-CM | POA: Diagnosis not present

## 2018-01-02 DIAGNOSIS — Y907 Blood alcohol level of 200-239 mg/100 ml: Secondary | ICD-10-CM | POA: Diagnosis present

## 2018-01-02 DIAGNOSIS — F10129 Alcohol abuse with intoxication, unspecified: Secondary | ICD-10-CM | POA: Diagnosis present

## 2018-01-02 DIAGNOSIS — S129XXA Fracture of neck, unspecified, initial encounter: Secondary | ICD-10-CM | POA: Diagnosis present

## 2018-01-02 DIAGNOSIS — S62521A Displaced fracture of distal phalanx of right thumb, initial encounter for closed fracture: Secondary | ICD-10-CM | POA: Diagnosis present

## 2018-01-02 DIAGNOSIS — M25461 Effusion, right knee: Secondary | ICD-10-CM | POA: Diagnosis present

## 2018-01-02 DIAGNOSIS — S12600A Unspecified displaced fracture of seventh cervical vertebra, initial encounter for closed fracture: Secondary | ICD-10-CM | POA: Diagnosis present

## 2018-01-02 LAB — URINALYSIS, ROUTINE W REFLEX MICROSCOPIC
Bilirubin Urine: NEGATIVE
Glucose, UA: NEGATIVE mg/dL
Hgb urine dipstick: NEGATIVE
Ketones, ur: NEGATIVE mg/dL
Leukocytes, UA: NEGATIVE
Nitrite: NEGATIVE
Protein, ur: NEGATIVE mg/dL
Specific Gravity, Urine: 1.033 — ABNORMAL HIGH (ref 1.005–1.030)
pH: 6 (ref 5.0–8.0)

## 2018-01-02 MED ORDER — OXYCODONE HCL 5 MG PO TABS
5.0000 mg | ORAL_TABLET | ORAL | Status: DC | PRN
  Administered 2018-01-02: 5 mg via ORAL
  Filled 2018-01-02: qty 1

## 2018-01-02 MED ORDER — ENOXAPARIN SODIUM 40 MG/0.4ML ~~LOC~~ SOLN
40.0000 mg | Freq: Every day | SUBCUTANEOUS | Status: DC
  Administered 2018-01-02 – 2018-01-04 (×3): 40 mg via SUBCUTANEOUS
  Filled 2018-01-02 (×3): qty 0.4

## 2018-01-02 MED ORDER — ONDANSETRON HCL 4 MG/2ML IJ SOLN: 4.0000 mg | Freq: Four times a day (QID) | INTRAMUSCULAR | Status: DC | PRN

## 2018-01-02 MED ORDER — CEFAZOLIN SODIUM-DEXTROSE 1-4 GM/50ML-% IV SOLN
1.0000 g | Freq: Three times a day (TID) | INTRAVENOUS | Status: AC
  Administered 2018-01-02 (×3): 1 g via INTRAVENOUS
  Filled 2018-01-02 (×3): qty 50

## 2018-01-02 MED ORDER — OXYCODONE HCL 5 MG PO TABS
10.0000 mg | ORAL_TABLET | ORAL | Status: DC | PRN
  Administered 2018-01-02 – 2018-01-03 (×4): 10 mg via ORAL
  Filled 2018-01-02 (×4): qty 2

## 2018-01-02 MED ORDER — HYDROMORPHONE HCL 1 MG/ML IJ SOLN: 1.0000 mg | INTRAMUSCULAR | Status: DC | PRN

## 2018-01-02 MED ORDER — ONDANSETRON 4 MG PO TBDP: 4.0000 mg | ORAL_TABLET | Freq: Four times a day (QID) | ORAL | Status: DC | PRN
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
## 2018-01-02 NOTE — Progress Notes (Signed)
Arrived from ED. Alert and oriented. C/O of pain 8/10 rt thumb. Rt thumb dressing and splint CDI. Aspen collar in placed. Call light within reach.

## 2018-01-02 NOTE — ED Notes (Signed)
Patient switched to aspen collar

## 2018-01-02 NOTE — Progress Notes (Signed)
Orthopedic Tech Progress Note Patient Details:  Michael Rosales 07-16-1951 [id number]  Ortho Devices Type of Ortho Device: Finger splint Ortho Device/Splint Location: rue thumb Ortho Device/Splint Interventions: Ordered, Application, Adjustment   Post Interventions Patient Tolerated: Well Instructions Provided: Care of device, Adjustment of device   Karolee Stamps 01/02/2018, 1:52 AM

## 2018-01-03 LAB — HIV ANTIBODY (ROUTINE TESTING W REFLEX): HIV Screen 4th Generation wRfx: NONREACTIVE

## 2018-01-03 LAB — CDS SEROLOGY

## 2018-01-03 MED ORDER — CEPHALEXIN 500 MG PO CAPS: 500.0000 mg | ORAL_CAPSULE | Freq: Four times a day (QID) | ORAL | 0 refills | Status: AC

## 2018-01-03 MED ORDER — VITAMIN B-1 100 MG PO TABS
100.0000 mg | ORAL_TABLET | Freq: Every day | ORAL | Status: DC
  Administered 2018-01-03 – 2018-01-04 (×2): 100 mg via ORAL
  Filled 2018-01-03 (×2): qty 1

## 2018-01-03 MED ORDER — ADULT MULTIVITAMIN W/MINERALS CH
1.0000 | ORAL_TABLET | Freq: Every day | ORAL | Status: DC
  Administered 2018-01-03 – 2018-01-04 (×2): 1 via ORAL
  Filled 2018-01-03 (×2): qty 1

## 2018-01-03 MED ORDER — FOLIC ACID 1 MG PO TABS
1.0000 mg | ORAL_TABLET | Freq: Every day | ORAL | Status: DC
  Administered 2018-01-03 – 2018-01-04 (×2): 1 mg via ORAL
  Filled 2018-01-03 (×2): qty 1

## 2018-01-03 MED ORDER — ACETAMINOPHEN 325 MG PO TABS
650.0000 mg | ORAL_TABLET | Freq: Four times a day (QID) | ORAL | Status: DC
  Administered 2018-01-03 – 2018-01-04 (×6): 650 mg via ORAL
  Filled 2018-01-03 (×6): qty 2

## 2018-01-03 MED ORDER — LORAZEPAM 2 MG/ML IJ SOLN: 2.0000 mg | INTRAMUSCULAR | Status: DC | PRN

## 2018-01-03 MED ORDER — LORAZEPAM 1 MG PO TABS: 2.0000 mg | ORAL_TABLET | ORAL | Status: DC | PRN

## 2018-01-03 MED ORDER — HYDROMORPHONE HCL 1 MG/ML IJ SOLN: 1.0000 mg | INTRAMUSCULAR | Status: DC | PRN

## 2018-01-03 MED ORDER — DOCUSATE SODIUM 100 MG PO CAPS
100.0000 mg | ORAL_CAPSULE | Freq: Every day | ORAL | Status: DC
  Administered 2018-01-03 – 2018-01-04 (×2): 100 mg via ORAL
  Filled 2018-01-03 (×2): qty 1
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
## 2018-01-03 NOTE — Progress Notes (Signed)
Pt.'s heart rate on monitor noted to be in the 40s. Assessed patient and he stated he was sleeping. Pulse 50s now, SB on monitor. Noted SB on admission to ER. [place], [place] notified of heart rate. No further orders received. Will monitor.
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
## 2018-01-03 NOTE — Progress Notes (Signed)
Orthopedic progress note:  Images and thumb viewed again by Dr. Alain Marion.   Continue plan for non-operative management right thumb fracture and local wound care.  NWB Right hand.   Elevate.   Smoking cessation. Plan for him to take Keflex for 2 weeks.  We would like to see him in the office on Friday 01/06/18.
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
## 2018-01-04 MED ORDER — CEPHALEXIN 500 MG PO CAPS
500.0000 mg | ORAL_CAPSULE | Freq: Two times a day (BID) | ORAL | Status: DC
  Administered 2018-01-04: 500 mg via ORAL
  Filled 2018-01-04: qty 1

## 2018-01-04 MED ORDER — INFLUENZA VAC SPLIT QUAD 0.5 ML IM SUSY: 0.5000 mL | PREFILLED_SYRINGE | INTRAMUSCULAR | Status: DC

## 2018-01-04 MED ORDER — IBUPROFEN 200 MG PO TABS
400.0000 mg | ORAL_TABLET | Freq: Four times a day (QID) | ORAL | Status: DC | PRN
  Administered 2018-01-04: 400 mg via ORAL
  Filled 2018-01-04: qty 2

## 2018-01-04 MED ORDER — OXYCODONE HCL 5 MG PO TABS: 5.0000 mg | ORAL_TABLET | Freq: Four times a day (QID) | ORAL | 0 refills | Status: DC | PRN

## 2018-01-04 MED ORDER — POLYETHYLENE GLYCOL 3350 17 G PO PACK
17.0000 g | PACK | Freq: Every day | ORAL | Status: DC
  Filled 2018-01-04: qty 1

## 2018-01-04 MED ORDER — CEPHALEXIN 500 MG PO CAPS
500.0000 mg | ORAL_CAPSULE | Freq: Two times a day (BID) | ORAL | 0 refills | Status: DC
Start: 2018-01-04 — End: 2018-11-13
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
## 2018-01-04 NOTE — Progress Notes (Signed)
Patient ID: Michael Rosales, male   DOB: Jul 29, 1951, 67 y.o.   MRN: [id number] Subjective: Patient reports no arm pain, no real neck pain, no NTW  Objective: Vital signs in last 24 hours: Temp:  [98 F (36.7 C)-99.2 F (37.3 C)] 99.2 F (37.3 C) (02/06 1029) Pulse Rate:  [50-62] 56 (02/06 1029) Resp:  [16-18] 16 (02/06 1029) BP: (99-129)/(57-75) 129/75 (02/06 1029) SpO2:  [96 %-98 %] 97 % (02/06 1029)  Intake/Output from previous day: 02/05 0701 - 02/06 0700 In: -  Out: 1450 [Urine:1450] Intake/Output this shift: No intake/output data recorded.  Neurologic: Grossly normal, in collar  Lab Results: Lab Results  Component Value Date   WBC 4.6 01/01/2018   HGB 12.2 (L) 01/01/2018   HCT 36.0 (L) 01/01/2018   MCV 73.7 (L) 01/01/2018   PLT 328 01/01/2018   Lab Results  Component Value Date   INR 0.98 01/01/2018   BMET Lab Results  Component Value Date   NA 143 01/01/2018   K 3.6 01/01/2018   CL 109 01/01/2018   CO2 20 (L) 01/01/2018   GLUCOSE 91 01/01/2018   BUN 12 01/01/2018   CREATININE 1.00 01/01/2018   CALCIUM 9.0 01/01/2018    Studies/Results: No results found.  Assessment/Plan: Doing ok in collar, f/u with me in 2 wks for xrays  Estimated body mass index is 24.74 kg/m as calculated from the following:   Height as of this encounter: 6\' 2"  (1.88 m).   Weight as of this encounter: 87.4 kg (192 lb 10.9 oz).    LOS: 2 days    JONES,DAVID S 01/04/2018, 10:51 AM
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
## 2018-01-04 NOTE — Progress Notes (Signed)
Pt discharged at this time.  Verbalizes understanding of all discharge instructions, including follow-up appointments, and where to pick up prescriptions called in. Pt will be going back to his boarding house via bus.  He has a bus pass in hand and all belongings.

## 2018-01-06 ENCOUNTER — Encounter (HOSPITAL_COMMUNITY): Payer: Self-pay | Admitting: Vascular Surgery

## 2018-07-13 ENCOUNTER — Other Ambulatory Visit: Payer: Self-pay | Admitting: Urology

## 2018-07-13 DIAGNOSIS — R972 Elevated prostate specific antigen [PSA]: Secondary | ICD-10-CM

## 2018-07-27 ENCOUNTER — Other Ambulatory Visit: Payer: Self-pay

## 2018-08-09 ENCOUNTER — Ambulatory Visit
Admission: RE | Admit: 2018-08-09 | Discharge: 2018-08-09 | Disposition: A | Discharge status: Home / Self Care | Payer: Medicare Other | Source: Ambulatory Visit | Attending: Urology | Admitting: Urology

## 2018-08-09 DIAGNOSIS — R972 Elevated prostate specific antigen [PSA]: Secondary | ICD-10-CM

## 2018-08-09 MED ORDER — GADOBENATE DIMEGLUMINE 529 MG/ML IV SOLN
17.0000 mL | Freq: Once | INTRAVENOUS | Status: AC | PRN
  Administered 2018-08-09: 17 mL via INTRAVENOUS

## 2018-11-08 NOTE — Progress Notes (Signed)
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 A PI-RADS category 3 lesion is labeled region of interest # 1 in the left peripheral zone as detailed above. Targeting data sent to [place]. 2. Mild prostatomegaly, prostate volume nearly 52 cubic cm.  Past/Anticipated interventions by urology, if any: prostate biopsy, referral to radiation oncology for consideration of brachytherapy  Past/Anticipated interventions by medical oncology, if any: no  Weight changes, if any: no  Bowel/Bladder complaints, if any: IPSS 10. SHIM 16. Reports urinary urgency as high biggest complaint. Denies dysuria, hematuria, urinary leakage or incontinence.    Nausea/Vomiting, if any: no  Pain issues, if any:  Chronic right knee pain and arthritis.  SAFETY ISSUES:  Prior radiation? no  Pacemaker/ICD? no  Possible current pregnancy? N/A  Is the patient on methotrexate? no  Current Complaints / other details:  67 year old male. Single. Resides in [place]. No children. Father has unknown type of canccer. Current smoker since 06/29/1988; No family history of prostate cancer; He is not interested in active surveillance and would like to pursue brachytherapy if appropriate.

## 2018-11-13 ENCOUNTER — Encounter: Payer: Self-pay | Admitting: Medical Oncology

## 2018-11-13 ENCOUNTER — Other Ambulatory Visit: Payer: Self-pay

## 2018-11-13 ENCOUNTER — Encounter: Payer: Self-pay | Admitting: Radiation Oncology

## 2018-11-13 ENCOUNTER — Ambulatory Visit
Admission: RE | Admit: 2018-11-13 | Discharge: 2018-11-13 | Disposition: A | Discharge status: Home / Self Care | Payer: Medicare Other | Source: Ambulatory Visit | Attending: Radiation Oncology | Admitting: Radiation Oncology

## 2018-11-13 VITALS — BP 114/74 | HR 68 | Temp 97.9°F | Resp 18 | Ht 74.0 in | Wt 192.4 lb

## 2018-11-13 DIAGNOSIS — Z79899 Other long term (current) drug therapy: Secondary | ICD-10-CM | POA: Insufficient documentation

## 2018-11-13 DIAGNOSIS — C61 Malignant neoplasm of prostate: Secondary | ICD-10-CM

## 2018-11-13 DIAGNOSIS — F17210 Nicotine dependence, cigarettes, uncomplicated: Secondary | ICD-10-CM | POA: Diagnosis not present

## 2018-11-13 HISTORY — DX: Malignant neoplasm of prostate: C61
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
## 2018-11-13 NOTE — Progress Notes (Signed)
See progress note under physician encounter. 

## 2018-11-13 NOTE — Progress Notes (Signed)
Spoke with patient to introduce myself as the prostate nurse navigator and my role. I was unable to meet him the morning when he consulted with Dr. Tammi Klippel. He states the visit went well and all questions were answered. He is interested in brachytherapy. He met Enid Derry this morning and is aware she will be calling him with future appointments. Encouraged him to call with questions or concerns. He voiced understanding.

## 2018-11-16 ENCOUNTER — Telehealth: Payer: Self-pay | Admitting: *Deleted

## 2018-11-16 NOTE — Telephone Encounter (Signed)
CALLED AND UPDATED PATIENT REGARDING APPTS., SPOKE WITH PATIENT

## 2018-11-17 NOTE — Addendum Note (Signed)
Encounter addended by: Heywood Footman, RN on: 11/17/2018 4:52 PM  Actions taken: Actions taken from a BestPractice Advisory, Visit Navigator Flowsheet section accepted

## 2018-11-21 ENCOUNTER — Encounter: Payer: Self-pay | Admitting: *Deleted

## 2018-11-21 NOTE — Progress Notes (Signed)
[place] Psychosocial Distress Screening Clinical Social Work  Clinical Social Work was referred by distress screening protocol.  The patient scored a 8 on the Psychosocial Distress Thermometer which indicates severe distress. Clinical Social Worker contacted patient by phone to assess for distress and other psychosocial needs.  Michael Rosales reported he lives in a boarding house and rides his bicycle to appointments or to run errands.  He reported no concerns regarding previously completed distress screen.  CSW explored patients feelings and discussed CSW role.  Patient plans to contact CSW as needs or questions arise.  ONCBCN DISTRESS SCREENING 11/17/2018  Screening Type Initial Screening  Distress experienced in past week (1-10) 8  Practical problem type Housing;Transportation  Emotional problem type Depression;Nervousness/Anxiety;Isolation/feeling alone;Boredom  Physical Problem type Pain;Getting around;Loss of appetitie;Sexual problems  Physician notified of physical symptoms Yes  Referral to clinical psychology No  Referral to clinical social work Yes  Referral to financial advocate Yes  Other Says to call anytime. Resides in boarding house in [place].     Gwinda Maine, LCSW

## 2018-12-11 ENCOUNTER — Other Ambulatory Visit: Payer: Self-pay | Admitting: Urology

## 2018-12-11 ENCOUNTER — Telehealth: Payer: Self-pay | Admitting: *Deleted

## 2018-12-11 NOTE — Telephone Encounter (Signed)
Called patient to inform of implant date, no answer, unable to leave message, will call later

## 2018-12-21 ENCOUNTER — Telehealth: Payer: Self-pay | Admitting: *Deleted

## 2018-12-21 NOTE — Telephone Encounter (Signed)
Called patient to remind of pre-seed appts. For 12-22-18, spoke with patient and he is aware of these appts.

## 2018-12-22 ENCOUNTER — Encounter: Payer: Self-pay | Admitting: Medical Oncology

## 2018-12-22 ENCOUNTER — Ambulatory Visit
Admission: RE | Admit: 2018-12-22 | Discharge: 2018-12-22 | Disposition: A | Discharge status: Home / Self Care | Payer: Medicare Other | Source: Ambulatory Visit | Attending: Urology | Admitting: Urology

## 2018-12-22 ENCOUNTER — Ambulatory Visit
Admission: RE | Admit: 2018-12-22 | Discharge: 2018-12-22 | Disposition: A | Discharge status: Home / Self Care | Payer: Medicare Other | Source: Ambulatory Visit | Attending: Radiation Oncology | Admitting: Radiation Oncology

## 2018-12-22 DIAGNOSIS — Z79899 Other long term (current) drug therapy: Secondary | ICD-10-CM | POA: Insufficient documentation

## 2018-12-22 DIAGNOSIS — C61 Malignant neoplasm of prostate: Secondary | ICD-10-CM

## 2018-12-22 DIAGNOSIS — F17210 Nicotine dependence, cigarettes, uncomplicated: Secondary | ICD-10-CM | POA: Diagnosis not present

## 2018-12-22 NOTE — Progress Notes (Signed)
[facility] On[facility]         [phone] ________________________________  Name: Michael Rosales MRN: [id number]  Date: 12/22/2018  DOB: 11/16/1951  SIMULATION AND TREATMENT PLANNING NOTE PUBIC ARCH STUDY  [id number], Pcp Not In  Festus Aloe, MD  DIAGNOSIS: 68 y.o. gentleman with low risk, Stage T1c adenocarcinoma of the prostate with Gleason Score of 3+3, and PSA of 7     ICD-10-CM   1. Malignant neoplasm of prostate ([place]) C61     COMPLEX SIMULATION:  The patient presented today for evaluation for possible prostate seed implant. He was brought to the radiation planning suite and placed supine on the CT couch. A 3-dimensional image study set was obtained in upload to the planning computer. There, on each axial slice, I contoured the prostate gland. Then, using three-dimensional radiation planning tools I reconstructed the prostate in view of the structures from the transperineal needle pathway to assess for possible pubic arch interference. In doing so, I did not appreciate any pubic arch interference. Also, the patient's prostate volume was estimated based on the drawn structure. The volume was 46 cc.  Given the pubic arch appearance and prostate volume, patient remains a good candidate to proceed with prostate seed implant. Today, he freely provided informed written consent to proceed.    PLAN: The patient will undergo prostate seed implant.   ________________________________  Sheral Apley. Tammi Klippel, M.D.   This document serves as a record of services personally performed by Tyler Pita, MD. It was created on his behalf by Wilburn Mylar, a trained medical scribe. The creation of this record is based on the scribe's personal observations and the provider's statements to them. This document has been checked and approved by the attending provider.

## 2018-12-25 ENCOUNTER — Telehealth: Payer: Self-pay | Admitting: *Deleted

## 2018-12-25 NOTE — Telephone Encounter (Signed)
CALLED PATIENT TO INFORM OF APPT. FOR CHEST X-RAY AND EKG FOR 12-29-18 - ARRIVAL TIME- 9:45 AM @ [facility] ADMITTING, SPOKE WITH PATIENT AND HE IS AWARE OF THIS APPT.

## 2018-12-27 ENCOUNTER — Encounter: Payer: Self-pay | Admitting: *Deleted

## 2018-12-28 NOTE — Progress Notes (Signed)
[facility] Clinical Social Work  Clinical Social Work met with patient after SIM appointment to assess for psychosocial needs.  Patient living in boarding house that no longer has power and was informed the residence was being condemned.  Patient unable to stay in shelter and has no friends/family to reside with.  CSW applied for [facility] program on patient's behalf.  Patient is aware and plans to follow up with CSW [place] 1/31 to receive further information and discuss other housing programs.   Lauren Somers, LCSW  Clinical Social Worker [facility]         

## 2018-12-29 ENCOUNTER — Inpatient Hospital Stay: Payer: Medicare Other | Admitting: General Practice

## 2018-12-29 ENCOUNTER — Ambulatory Visit (HOSPITAL_COMMUNITY)
Admission: RE | Admit: 2018-12-29 | Discharge: 2018-12-29 | Disposition: A | Discharge status: Home / Self Care | Payer: Medicare Other | Source: Ambulatory Visit | Attending: Urology | Admitting: Urology

## 2018-12-29 ENCOUNTER — Encounter: Payer: Self-pay | Admitting: General Practice

## 2018-12-29 ENCOUNTER — Other Ambulatory Visit: Payer: Self-pay | Admitting: Urology

## 2018-12-29 ENCOUNTER — Encounter (HOSPITAL_COMMUNITY)
Admission: RE | Admit: 2018-12-29 | Discharge: 2018-12-29 | Disposition: A | Discharge status: Home / Self Care | Payer: Medicare Other | Source: Ambulatory Visit | Attending: Urology | Admitting: Urology

## 2018-12-29 DIAGNOSIS — C61 Malignant neoplasm of prostate: Secondary | ICD-10-CM

## 2018-12-29 NOTE — Progress Notes (Signed)
[facility] CSW Progress Notes  Met w patient to complete referral (VI-SPIDAT) to [facility] for consideration for help w housing for homeless individuals.  Referral sent to [facility].  Provided 31 day disability bus pass and information packet on [employer] - patient will be housed at [facility] [address] as a participant in this program.  [facility] will evaluate referral and determine if they can assist.  Edwyna Shell, [place] Worker Phone:  [phone]

## 2019-01-04 ENCOUNTER — Encounter: Payer: Self-pay | Admitting: Hospice and Palliative Medicine

## 2019-01-04 ENCOUNTER — Encounter: Payer: Self-pay | Admitting: Nurse Practitioner
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
## 2019-01-04 NOTE — Progress Notes (Signed)
This encounter was created in error - please disregard.

## 2019-01-11 ENCOUNTER — Telehealth: Payer: Self-pay
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
## 2019-01-18 ENCOUNTER — Telehealth: Payer: Self-pay | Admitting: *Deleted

## 2019-01-18 NOTE — Telephone Encounter (Signed)
CALLED PATIENT TO REMIND OF LAB APPT. FOR 01-19-19 - ARRIVAL TIME - 1:30 PM @ [facility]MITT[facility], SPOKE WITH PATIENT AND HE IS AWARE OF THIS APPT.

## 2019-01-19 ENCOUNTER — Other Ambulatory Visit: Payer: Self-pay

## 2019-01-19 ENCOUNTER — Encounter (HOSPITAL_COMMUNITY)
Admission: RE | Admit: 2019-01-19 | Discharge: 2019-01-19 | Disposition: A | Discharge status: Home / Self Care | Payer: Medicare Other | Source: Ambulatory Visit | Attending: Urology | Admitting: Urology

## 2019-01-19 ENCOUNTER — Encounter (HOSPITAL_BASED_OUTPATIENT_CLINIC_OR_DEPARTMENT_OTHER): Payer: Self-pay | Admitting: *Deleted

## 2019-01-19 DIAGNOSIS — Z01812 Encounter for preprocedural laboratory examination: Secondary | ICD-10-CM | POA: Diagnosis present

## 2019-01-19 LAB — COMPREHENSIVE METABOLIC PANEL
ALT: 15 U/L (ref 0–44)
AST: 34 U/L (ref 15–41)
Albumin: 3.6 g/dL (ref 3.5–5.0)
Alkaline Phosphatase: 53 U/L (ref 38–126)
Anion gap: 7 (ref 5–15)
BUN: 12 mg/dL (ref 8–23)
CO2: 20 mmol/L — ABNORMAL LOW (ref 22–32)
Calcium: 8.8 mg/dL — ABNORMAL LOW (ref 8.9–10.3)
Chloride: 113 mmol/L — ABNORMAL HIGH (ref 98–111)
Creatinine, Ser: 0.8 mg/dL (ref 0.61–1.24)
GFR calc Af Amer: >60 mL/min (ref >60)
GFR calc non Af Amer: >60 mL/min (ref >60)
Glucose, Bld: 111 mg/dL — ABNORMAL HIGH (ref 70–99)
Potassium: 4.4 mmol/L (ref 3.5–5.1)
Sodium: 140 mmol/L (ref 135–145)
Total Bilirubin: 0.2 mg/dL — ABNORMAL LOW (ref 0.3–1.2)
Total Protein: 7.1 g/dL (ref 6.5–8.1)

## 2019-01-19 LAB — CBC
HCT: 29.5 % — ABNORMAL LOW (ref 39.0–52.0)
Hemoglobin: 7.8 g/dL — ABNORMAL LOW (ref 13.0–17.0)
MCH: 18.8 pg — ABNORMAL LOW (ref 26.0–34.0)
MCHC: 26.4 g/dL — ABNORMAL LOW (ref 30.0–36.0)
MCV: 70.9 fL — ABNORMAL LOW (ref 80.0–100.0)
Platelets: 288 10*3/uL (ref 150–400)
RBC: 4.16 MIL/uL — ABNORMAL LOW (ref 4.22–5.81)
RDW: 20.9 % — ABNORMAL HIGH (ref 11.5–15.5)
WBC: 4.1 10*3/uL (ref 4.0–10.5)
nRBC: 0.5 % — ABNORMAL HIGH (ref 0.0–0.2)

## 2019-01-19 LAB — PROTIME-INR
INR: 1.07
Prothrombin Time: 13.8 seconds (ref 11.4–15.2)

## 2019-01-19 LAB — APTT: aPTT: 29 seconds (ref 24–36)

## 2019-01-19 NOTE — Progress Notes (Signed)
Your procedure is scheduled on : __02-28-2020  Friday_     Report to [facility] AT:  5:30  AM    Call this number if you have problems the morning of surgery  :[phone].     OUR ADDRESS IS [address].  WE ARE LOCATED [facility].      SPECIAL INSTRUCTIONS:  DO ONE FLEET ENEMA MORNING OF SURGERY                                        REMEMBER:  DO NOT EAT FOOD OR DRINK LIQUIDS AFTER MIDNIGHT .     TAKE THESE MEDICATIONS MORNING OF SURGERY WITH A SIP OF WATER:   Buspar,  Celexa,  Gabapentin                                       DO NOT WEAR JEWERLY, OR NAIL POLISH,   DO NOT WEAR LOTIONS, POWDERS, PERFUMES OR DEODORANT.  MEN MAY SHAVE FACE AND NECK.  CONTACTS, GLASSES, OR DENTURES MAY NOT BE WORN TO SURGERY.                                    [facility] IS NOT RESPONSIBLE  FOR ANY BELONGINGS.                                                                    Marland Kitchen

## 2019-01-19 NOTE — Progress Notes (Addendum)
Spoke w/ pt face to face for pre-op interview.   Npo after mn.  Arrive at 0530.  Lab work done today.  Current ekg and cxr in chart and epic.  Will take am meds w/ sips of water and to do one fleet enema morning of surgery.  Printed instructions given to pt.  Pt having issue with transportation after discharge.  Pt is working with Education officer, museum at cancer center for transportation.  Also, pt verbalized understanding the has to have a caregiver at discharge and recommended 24 hours after surgery due to anesthesia.   ADDENDUM:  Called office yesterday and left message with coni, or scheduler,  pt does not have anyone at all to stay with him after surgery and the only that could be arranged by cancer center social worker is transportation to bring to surgery and be picked up that next after overnight stay.  Received call back from office and dr eskridge has pt to stay overnight.  Called and spoke w/ Ander Purpura social worker via phone and will get transportation arranged for pt Friday and for pick up on Saturday.  Ander Purpura is going to bring the voucher pt needs for Saturday to pre-surgical testing and I will take to over to [facility] to pt.

## 2019-01-22 ENCOUNTER — Telehealth: Payer: Self-pay | Admitting: *Deleted

## 2019-01-22 NOTE — Telephone Encounter (Signed)
CSW received phone call from Jeral Fruit regarding patient's upcoming seed placement surgery.  RN notified patient must have someone with him for observation following seed placement.  CSW is unaware of possible options, as patient has no family or friend support available.   CSW contacted congregational nurse program, Lessie Dings, and discussed situation.  Congregational nursing team will follow up with CSW.  Maryjean Morn, MSW, LCSW, OSW-C Clinical Social Worker [facility] [phone]

## 2019-01-24 NOTE — H&P (Signed)
Office Visit Report     01/18/2019   --------------------------------------------------------------------------------   Michael Rosales  MRN: [id number]  PRIMARY CARE:  Kevan Ny, MD  DOB: 1951-03-30, 68 year old Male  REFERRING:  Georgette Dover, MD  SSN:  [id number]  PROVIDER:  Festus Aloe, M.D.    LOCATION:  [facility] [phone]   --------------------------------------------------------------------------------   CC/HPI: I have prostate cancer (treatment).     Low - intermediate risk prostate cancer November 2019  PSA 7 - 36 -- most recent 16  T1c 63 gram prostate  Gleason 3+3 = 6, 5% left apex  Region of interest negative 3  Staging MRI: 1.7 cm left peripheral zone lesions PI-RADS 3. Prostate was 5.8 x 4.3 x 4.3 = 55 grams. Staging negative.   SHIM = 22  AUASS = 14 - bothersome urgency. He has been taking finasteride. Postvoid was 0.   He is scheduled for brachytherapy. He has some urgency. He has mild dysuria but denies sexual activity.    ALLERGIES: No Allergies    MEDICATIONS: Finasteride 5 mg tablet 1 tablet PO Daily  Acid Reducer  Citalopram Hbr 40 mg tablet Oral  Glucosamine & Chondroitin  Ibuprofen 800 mg tablet Oral  Metronidazole 500 mg tablet 4 tablet PO once  Mirtazapine 15 mg tablet Oral  Tramadol Hcl 50 mg tablet Oral  Trazodone Hcl 150 mg tablet     GU PSH: Prostate Needle Biopsy - 10/04/2018, 2017      PSH Notes: No Surgical Problems   NON-GU PSH: Surgical Pathology, Gross And Microscopic Examination For Prostate Needle - 10/04/2018, 2017    GU PMH: Prostate Cancer - 12/08/2018, - 10/17/2018 Elevated PSA - 10/04/2018, - 08/24/2018, - 07/12/2018 (Stable), I went over his pathology report with him today. He has revealed no evidence of prostate cancer. At this point my recommendation is to continue observation every 6 months with DRE and PSA., - 09/30/2016, Prostate biopsy showed no sign of prostate cancer or atypia. I have  recommended continued surveillance with repeat DRE and PSA again in 6 months., - 08/03/2016, - 2017 (Stable), I went over his pathology report with him today which has revealed no evidence of adenocarcinoma despite his markedly elevated PSA. We discussed the need for continued monitoring of his DRE and PSA every 6 months, - 2017, - 2017, Elevated PSA, - 2017 BPH w/LUTS - 07/12/2018 Urinary Urgency - 07/12/2018 Urinary Tract Inf, Unspec site, Pyuria - 2017      PMH Notes: Elevated PSA: He was noted to have a PSA of 16.39 in 1/17. His DRE revealed subtle induration involving the lateral aspect of the right lobe but no nodularity. There is no family history of prostate cancer.     NON-GU PMH: Bacteriuria - 12/08/2018 Anxiety, Anxiety - 2017 Encounter for general adult medical examination without abnormal findings, Encounter for preventive health examination - 2017 Personal history of other diseases of the digestive system, History of esophageal reflux - 2017 Personal history of other diseases of the musculoskeletal system and connective tissue, History of arthritis - 2017 Personal history of other diseases of the nervous system and sense organs, History of sleep apnea - 2017 Personal history of other mental and behavioral disorders, History of depression - 2017    FAMILY HISTORY: Arthritis - Runs In Family   SOCIAL HISTORY: Marital Status: Single Preferred Language: English; Ethnicity: Not Hispanic Or Latino; Race: Black or African American Current Smoking Status: Patient smokes. Has smoked since 06/29/1988.  Tobacco Use Assessment Completed: Used Tobacco in last 30 days? Does not use smokeless tobacco. Types of alcohol consumed: Beer.  Does not drink caffeine.     Notes: Alcohol use, Single, Current smoker, Caffeine use: occasional use   REVIEW OF SYSTEMS:    GU Review Male:   Patient denies frequent urination, hard to postpone urination, burning/ pain with urination, get up at night to  urinate, leakage of urine, stream starts and stops, trouble starting your stream, have to strain to urinate , erection problems, and penile pain.  Gastrointestinal (Upper):   Patient denies nausea, vomiting, and indigestion/ heartburn.  Gastrointestinal (Lower):   Patient denies diarrhea and constipation.  Constitutional:   Patient denies fever, night sweats, weight loss, and fatigue.  Skin:   Patient denies skin rash/ lesion and itching.  Eyes:   Patient denies blurred vision and double vision.  Ears/ Nose/ Throat:   Patient denies sore throat and sinus problems.  Hematologic/Lymphatic:   Patient denies swollen glands and easy bruising.  Cardiovascular:   Patient denies leg swelling and chest pains.  Respiratory:   Patient denies cough and shortness of breath.  Endocrine:   Patient denies excessive thirst.  Musculoskeletal:   Patient denies back pain and joint pain.  Neurological:   Patient denies headaches and dizziness.  Psychologic:   Patient denies depression and anxiety.   VITAL SIGNS:      01/18/2019 08:40 AM  BP 108/64 mmHg  Heart Rate 63 /min  Temperature 97.6 F / 36.4 C   GU PHYSICAL EXAMINATION:    Scrotum: No lesions. No edema. No cysts. No warts.  Penis: Penis uncircumcised. No foreskin warts, no cracks. No dorsal peyronie's plaques, no left corporal peyronie's plaques, no right corporal peyronie's plaques, no scarring, no shaft warts. No balanitis, no meatal stenosis.   Prostate: 40 gram or 2+ size. Left lobe normal consistency, right lobe normal consistency. Symmetrical lobes. No prostate nodule. Left lobe no tenderness, right lobe no tenderness.   MULTI-SYSTEM PHYSICAL EXAMINATION:    Constitutional: Well-nourished. No physical deformities. Normally developed. Good grooming.  Neck: Neck symmetrical, not swollen. Normal tracheal position.  Respiratory: No labored breathing, no use of accessory muscles.   Cardiovascular: Normal temperature, normal extremity pulses, no  swelling, no varicosities.  Skin: No paleness, no jaundice, no cyanosis. No lesion, no ulcer, no rash.  Neurologic / Psychiatric: Oriented to time, oriented to place, oriented to person. No depression, no anxiety, no agitation.  Gastrointestinal: No mass, no tenderness, no rigidity, non obese abdomen.     PAST DATA REVIEWED:  Source Of History:  Patient   06/02/17 04/14/16  PSA  Total PSA 6.9 ng/dl 36.30     PROCEDURES:         Flexible Cystoscopy - 52000  Risks, benefits, and some of the potential complications of the procedure were discussed with the patient. All questions were answered. Informed consent was obtained. Antibiotic prophylaxis was given -- Cephalexin. Sterile technique and intraurethral analgesia were used.  Meatus:  Normal size. Normal location. Normal condition.  Urethra:  No strictures.  External Sphincter:  Normal.  Verumontanum:  Normal.  Prostate:  Borderline obstructing. Moderate hyperplasia.  Bladder Neck:  Non-obstructing.  Ureteral Orifices:  Normal location. Normal size. Normal shape. Effluxed clear urine.  Bladder:  No trabeculation. No tumors. Normal mucosa. No stones.      The lower urinary tract was carefully examined. The procedure was well-tolerated and without complications. Antibiotic instructions were given. Instructions were given to call  the office immediately for bloody urine, difficulty urinating, painful urination, fever, chills, nausea, vomiting or other illness. The patient stated that he understood these instructions and would comply with them.         Urinalysis Dipstick Dipstick Cont'd  Color: Yellow Bilirubin: Neg mg/dL  Appearance: Clear Ketones: Neg mg/dL  Specific Gravity: 1.025 Blood: Neg ery/uL  pH: 6.5 Protein: Neg mg/dL  Glucose: Neg mg/dL Urobilinogen: 0.2 mg/dL    Nitrites: Neg    Leukocyte Esterase: Neg leu/uL    ASSESSMENT:      ICD-10 Details  1 GU:   Urinary Urgency - R39.15   2   BPH w/LUTS - N40.1   3   Prostate  Cancer - C61    PLAN:            Medications New Meds: Tamsulosin Hcl 0.4 mg capsule 1 capsule PO Q HS   #45  1 Refill(s)            Schedule Return Visit/Planned Activity: Keep Scheduled Appointment - Schedule Surgery          Document Letter(s):  Created for Patient: Clinical Summary         Notes:   bph - on finasteride - discussed the nature r.b of tamsulosin which might be beneficial for him in the peri-op setting,   prostate cancer - discussed again the nature r/b of brachytherapy.         Next Appointment:      Next Appointment: 01/26/2019 07:30 AM    Appointment Type: Surgery     Location: [facility] [phone]    Provider: Festus Aloe, M.D.    Reason for Visit: NE/OP RAD SEED SPACE OAR      * Signed by Festus Aloe, M.D. on 01/19/19 at 3:50 PM (EST)*     The information contained in this medical record document is considered private and confidential patient information. This information can only be used for the medical diagnosis and/or medical services that are being provided by the patient's selected caregivers. This information can only be distributed outside of the patient's care if the patient agrees and signs waivers of authorization for this information to be sent to an outside source or route.  Addendum: He lives alone and will need to stay in extended care overnight.

## 2019-01-25 ENCOUNTER — Telehealth: Payer: Self-pay

## 2019-01-25 ENCOUNTER — Encounter: Payer: Self-pay | Admitting: *Deleted

## 2019-01-25 ENCOUNTER — Telehealth: Payer: Self-pay | Admitting: *Deleted

## 2019-01-25 NOTE — Telephone Encounter (Signed)
CALLED PATIENT TO REMIND OF PROCEDURE FOR 01-26-19, SPOKE WITH PATIENT AND HE IS AWARE OF THIS PROCEDURE

## 2019-01-25 NOTE — Congregational Nurse Program (Signed)
Pt calm indicates some chronic knee pain. Pt states he has new glasses that enables better vision for reading. He has  concerned about housing needs, states he will look at rooms next week after surgery if needed. Pt indicates he understood [employer] housing policy by stating he asked homeowner to give him first choice at room on lower level when complete if he takes room on second level next week. Pt states bus pass expires February 01, 2019, next week. Pt indicates he will be up and ready for taxi ride to  Medical facility on 01/26/2019.

## 2019-01-25 NOTE — Anesthesia Preprocedure Evaluation (Addendum)
Anesthesia Evaluation  Patient identified by MRN, date of birth, ID band Patient awake    Reviewed: Allergy & Precautions, NPO status , Patient's Chart, lab work & pertinent test results  History of Anesthesia Complications Negative for: history of anesthetic complications  Airway Mallampati: II  TM Distance: >3 FB Neck ROM: Full    Dental  (+) Dental Advisory Given, Edentulous Upper, Missing, Poor Dentition,    Pulmonary Current Smoker,    Pulmonary exam normal breath sounds clear to auscultation       Cardiovascular negative cardio ROS Normal cardiovascular exam Rhythm:Regular Rate:Normal     Neuro/Psych Anxiety Depression negative neurological ROS     GI/Hepatic negative GI ROS, Neg liver ROS,   Endo/Other  negative endocrine ROS  Renal/GU negative Renal ROS   Prostate ca    Musculoskeletal  (+) Arthritis ,   Abdominal   Peds  Hematology  (+) anemia , Hgb 7.8   Anesthesia Other Findings Day of surgery medications reviewed with the patient.  Reproductive/Obstetrics                            Anesthesia Physical Anesthesia Plan  ASA: II  Anesthesia Plan: General   Post-op Pain Management:    Induction: Intravenous  PONV Risk Score and Plan: 2 and Treatment may vary due to age or medical condition, Ondansetron and Dexamethasone  Airway Management Planned: LMA  Additional Equipment:   Intra-op Plan:   Post-operative Plan: Extubation in OR  Informed Consent: I have reviewed the patients History and Physical, chart, labs and discussed the procedure including the risks, benefits and alternatives for the proposed anesthesia with the patient or authorized representative who has indicated his/her understanding and acceptance.     Dental advisory given  Plan Discussed with: CRNA  Anesthesia Plan Comments:        Anesthesia Quick Evaluation

## 2019-01-25 NOTE — Progress Notes (Signed)
CSW scheduled taxi for patient to be picked up and transported to [address] at [place] faxed voucher to [facility].  CSW contacted patient and made aware.  Patient has no other concerns at this time.  He reported he has been working closely with [employer] program CSWs to obtain permanent housing.  Maryjean Morn, MSW, LCSW, OSW-C Clinical Social Worker [facility] [phone]

## 2019-01-26 ENCOUNTER — Encounter (HOSPITAL_COMMUNITY)
Admission: RE | Disposition: A | Discharge status: Home / Self Care | Payer: Self-pay | Source: Home / Self Care | Attending: Urology

## 2019-01-26 ENCOUNTER — Other Ambulatory Visit: Payer: Self-pay

## 2019-01-26 ENCOUNTER — Ambulatory Visit (HOSPITAL_BASED_OUTPATIENT_CLINIC_OR_DEPARTMENT_OTHER): Payer: Medicare Other | Admitting: Anesthesiology

## 2019-01-26 ENCOUNTER — Encounter: Payer: Self-pay | Admitting: *Deleted

## 2019-01-26 ENCOUNTER — Ambulatory Visit (HOSPITAL_COMMUNITY): Payer: Medicare Other

## 2019-01-26 ENCOUNTER — Observation Stay (HOSPITAL_BASED_OUTPATIENT_CLINIC_OR_DEPARTMENT_OTHER)
Admission: RE | Admit: 2019-01-26 | Discharge: 2019-01-27 | Disposition: A | Discharge status: Home / Self Care | Payer: Medicare Other | Attending: Urology | Admitting: Urology

## 2019-01-26 ENCOUNTER — Encounter (HOSPITAL_BASED_OUTPATIENT_CLINIC_OR_DEPARTMENT_OTHER): Payer: Self-pay

## 2019-01-26 DIAGNOSIS — R3915 Urgency of urination: Secondary | ICD-10-CM | POA: Insufficient documentation

## 2019-01-26 DIAGNOSIS — F329 Major depressive disorder, single episode, unspecified: Secondary | ICD-10-CM | POA: Diagnosis not present

## 2019-01-26 DIAGNOSIS — K219 Gastro-esophageal reflux disease without esophagitis: Secondary | ICD-10-CM | POA: Diagnosis not present

## 2019-01-26 DIAGNOSIS — F17200 Nicotine dependence, unspecified, uncomplicated: Secondary | ICD-10-CM | POA: Insufficient documentation

## 2019-01-26 DIAGNOSIS — Z23 Encounter for immunization: Secondary | ICD-10-CM | POA: Insufficient documentation

## 2019-01-26 DIAGNOSIS — C61 Malignant neoplasm of prostate: Principal | ICD-10-CM | POA: Diagnosis present

## 2019-01-26 DIAGNOSIS — G4730 Sleep apnea, unspecified: Secondary | ICD-10-CM | POA: Insufficient documentation

## 2019-01-26 DIAGNOSIS — Z791 Long term (current) use of non-steroidal anti-inflammatories (NSAID): Secondary | ICD-10-CM | POA: Diagnosis not present

## 2019-01-26 DIAGNOSIS — N401 Enlarged prostate with lower urinary tract symptoms: Secondary | ICD-10-CM | POA: Diagnosis not present

## 2019-01-26 DIAGNOSIS — Z79899 Other long term (current) drug therapy: Secondary | ICD-10-CM | POA: Diagnosis not present

## 2019-01-26 DIAGNOSIS — T884XXA Failed or difficult intubation, initial encounter: Secondary | ICD-10-CM | POA: Diagnosis present

## 2019-01-26 HISTORY — DX: Unspecified osteoarthritis, unspecified site: M19.90

## 2019-01-26 HISTORY — DX: Alcohol abuse, in remission: F10.11

## 2019-01-26 HISTORY — PX: SPACE OAR INSTILLATION: SHX6769

## 2019-01-26 HISTORY — PX: CYSTOSCOPY: SHX5120

## 2019-01-26 HISTORY — PX: RADIOACTIVE SEED IMPLANT: SHX5150

## 2019-01-26 HISTORY — DX: Failed or difficult intubation, initial encounter: T88.4XXA

## 2019-01-26 HISTORY — DX: Nocturia: R35.1

## 2019-01-26 LAB — POCT I-STAT 4, (NA,K, GLUC, HGB,HCT)
Glucose, Bld: 95 mg/dL (ref 70–99)
HCT: 26 % — ABNORMAL LOW (ref 39.0–52.0)
Hemoglobin: 8.8 g/dL — ABNORMAL LOW (ref 13.0–17.0)
Potassium: 3.8 mmol/L (ref 3.5–5.1)
Sodium: 138 mmol/L (ref 135–145)

## 2019-01-26 SURGERY — INSERTION, RADIATION SOURCE, PROSTATE
Anesthesia: General | Site: Rectum

## 2019-01-26 MED ORDER — ACETAMINOPHEN 325 MG PO TABS
650.0000 mg | ORAL_TABLET | ORAL | Status: DC | PRN
  Filled 2019-01-26: qty 2

## 2019-01-26 MED ORDER — SUCCINYLCHOLINE CHLORIDE 20 MG/ML IJ SOLN
INTRAMUSCULAR | Status: DC | PRN
  Administered 2019-01-26: 120 mg via INTRAVENOUS

## 2019-01-26 MED ORDER — SUCCINYLCHOLINE CHLORIDE 200 MG/10ML IV SOSY
PREFILLED_SYRINGE | INTRAVENOUS | Status: AC
  Filled 2019-01-26: qty 10

## 2019-01-26 MED ORDER — LACTATED RINGERS IV SOLN
INTRAVENOUS | Status: DC
  Administered 2019-01-26 (×2): via INTRAVENOUS
  Filled 2019-01-26: qty 1000

## 2019-01-26 MED ORDER — SODIUM CHLORIDE 0.9 % IV SOLN
INTRAVENOUS | Status: AC | PRN
  Administered 2019-01-26: 1000 mL

## 2019-01-26 MED ORDER — PROPOFOL 10 MG/ML IV BOLUS
INTRAVENOUS | Status: AC
  Filled 2019-01-26: qty 40

## 2019-01-26 MED ORDER — FENTANYL CITRATE (PF) 100 MCG/2ML IJ SOLN
INTRAMUSCULAR | Status: DC | PRN
  Administered 2019-01-26: 50 ug via INTRAVENOUS
  Administered 2019-01-26 (×2): 25 ug via INTRAVENOUS

## 2019-01-26 MED ORDER — DEXAMETHASONE SODIUM PHOSPHATE 10 MG/ML IJ SOLN
INTRAMUSCULAR | Status: AC
  Filled 2019-01-26: qty 1

## 2019-01-26 MED ORDER — INFLUENZA VAC SPLIT HIGH-DOSE 0.5 ML IM SUSY
0.5000 mL | PREFILLED_SYRINGE | INTRAMUSCULAR | Status: AC
  Administered 2019-01-27: 0.5 mL via INTRAMUSCULAR
  Filled 2019-01-26: qty 0.5

## 2019-01-26 MED ORDER — TAMSULOSIN HCL 0.4 MG PO CAPS: 0.4000 mg | ORAL_CAPSULE | Freq: Every day | ORAL | 0 refills | Status: DC

## 2019-01-26 MED ORDER — DOCUSATE SODIUM 100 MG PO CAPS
100.0000 mg | ORAL_CAPSULE | Freq: Two times a day (BID) | ORAL | Status: DC
  Administered 2019-01-26 – 2019-01-27 (×3): 100 mg via ORAL
  Filled 2019-01-26 (×4): qty 1

## 2019-01-26 MED ORDER — PROPOFOL 10 MG/ML IV BOLUS
INTRAVENOUS | Status: DC | PRN
  Administered 2019-01-26: 50 mg via INTRAVENOUS
  Administered 2019-01-26: 150 mg via INTRAVENOUS
  Administered 2019-01-26: 50 mg via INTRAVENOUS
  Administered 2019-01-26: 100 mg via INTRAVENOUS
  Administered 2019-01-26: 20 mg via INTRAVENOUS

## 2019-01-26 MED ORDER — FENTANYL CITRATE (PF) 100 MCG/2ML IJ SOLN
INTRAMUSCULAR | Status: AC
  Filled 2019-01-26: qty 2

## 2019-01-26 MED ORDER — IOHEXOL 300 MG/ML  SOLN
INTRAMUSCULAR | Status: DC | PRN
  Administered 2019-01-26: 7 mL via URETHRAL

## 2019-01-26 MED ORDER — ONDANSETRON HCL 4 MG/2ML IJ SOLN
INTRAMUSCULAR | Status: AC
  Filled 2019-01-26: qty 2

## 2019-01-26 MED ORDER — MIDAZOLAM HCL 5 MG/5ML IJ SOLN
INTRAMUSCULAR | Status: DC | PRN
  Administered 2019-01-26: 2 mg via INTRAVENOUS

## 2019-01-26 MED ORDER — LIDOCAINE 2% (20 MG/ML) 5 ML SYRINGE
INTRAMUSCULAR | Status: DC | PRN
  Administered 2019-01-26: 100 mg via INTRAVENOUS

## 2019-01-26 MED ORDER — FENTANYL CITRATE (PF) 100 MCG/2ML IJ SOLN
25.0000 ug | INTRAMUSCULAR | Status: DC | PRN
  Filled 2019-01-26: qty 1

## 2019-01-26 MED ORDER — DIPHENHYDRAMINE HCL 50 MG/ML IJ SOLN
12.5000 mg | Freq: Four times a day (QID) | INTRAMUSCULAR | Status: DC | PRN
  Filled 2019-01-26: qty 0.25

## 2019-01-26 MED ORDER — DIPHENHYDRAMINE HCL 12.5 MG/5ML PO ELIX
12.5000 mg | ORAL_SOLUTION | Freq: Four times a day (QID) | ORAL | Status: DC | PRN
  Filled 2019-01-26: qty 5

## 2019-01-26 MED ORDER — DEXAMETHASONE SODIUM PHOSPHATE 10 MG/ML IJ SOLN
INTRAMUSCULAR | Status: DC | PRN
  Administered 2019-01-26: 10 mg via INTRAVENOUS

## 2019-01-26 MED ORDER — ACETAMINOPHEN 10 MG/ML IV SOLN
1000.0000 mg | Freq: Once | INTRAVENOUS | Status: DC | PRN
  Filled 2019-01-26: qty 100

## 2019-01-26 MED ORDER — SODIUM CHLORIDE FLUSH 0.9 % IV SOLN
INTRAVENOUS | Status: DC | PRN
  Administered 2019-01-26: 3 mL

## 2019-01-26 MED ORDER — CIPROFLOXACIN IN D5W 400 MG/200ML IV SOLN
400.0000 mg | INTRAVENOUS | Status: AC
  Administered 2019-01-26: 400 mg via INTRAVENOUS
  Filled 2019-01-26: qty 200

## 2019-01-26 MED ORDER — OXYCODONE HCL 5 MG/5ML PO SOLN
5.0000 mg | Freq: Once | ORAL | Status: DC | PRN
  Filled 2019-01-26: qty 5

## 2019-01-26 MED ORDER — OXYCODONE HCL 5 MG PO TABS
5.0000 mg | ORAL_TABLET | Freq: Once | ORAL | Status: DC | PRN
  Filled 2019-01-26: qty 1

## 2019-01-26 MED ORDER — MENTHOL 3 MG MT LOZG
LOZENGE | OROMUCOSAL | Status: AC
  Filled 2019-01-26: qty 9

## 2019-01-26 MED ORDER — CIPROFLOXACIN IN D5W 400 MG/200ML IV SOLN
INTRAVENOUS | Status: AC
  Filled 2019-01-26: qty 200

## 2019-01-26 MED ORDER — PROMETHAZINE HCL 25 MG/ML IJ SOLN: 6.2500 mg | INTRAMUSCULAR | Status: DC | PRN

## 2019-01-26 MED ORDER — CEPHALEXIN 500 MG PO CAPS
500.0000 mg | ORAL_CAPSULE | Freq: Three times a day (TID) | ORAL | Status: DC
  Administered 2019-01-26 – 2019-01-27 (×4): 500 mg via ORAL
  Filled 2019-01-26 (×5): qty 1

## 2019-01-26 MED ORDER — CITALOPRAM HYDROBROMIDE 20 MG PO TABS
40.0000 mg | ORAL_TABLET | Freq: Every morning | ORAL | Status: DC
  Administered 2019-01-26 – 2019-01-27 (×2): 40 mg via ORAL
  Filled 2019-01-26 (×2): qty 2
  Filled 2019-01-26: qty 1

## 2019-01-26 MED ORDER — MIDAZOLAM HCL 2 MG/2ML IJ SOLN
INTRAMUSCULAR | Status: AC
  Filled 2019-01-26: qty 2

## 2019-01-26 MED ORDER — SODIUM CHLORIDE (PF) 0.9 % IJ SOLN
INTRAMUSCULAR | Status: DC | PRN
  Administered 2019-01-26: 10 mL

## 2019-01-26 MED ORDER — OXYCODONE HCL 5 MG PO TABS
5.0000 mg | ORAL_TABLET | ORAL | Status: DC | PRN
  Administered 2019-01-26 – 2019-01-27 (×3): 5 mg via ORAL
  Filled 2019-01-26 (×4): qty 1

## 2019-01-26 MED ORDER — GABAPENTIN 300 MG PO CAPS
300.0000 mg | ORAL_CAPSULE | Freq: Two times a day (BID) | ORAL | Status: DC
  Administered 2019-01-26 – 2019-01-27 (×3): 300 mg via ORAL
  Filled 2019-01-26 (×4): qty 1

## 2019-01-26 MED ORDER — BUSPIRONE HCL 5 MG PO TABS
20.0000 mg | ORAL_TABLET | Freq: Two times a day (BID) | ORAL | Status: DC
  Administered 2019-01-26 – 2019-01-27 (×3): 20 mg via ORAL
  Filled 2019-01-26 (×2): qty 4
  Filled 2019-01-26: qty 2
  Filled 2019-01-26: qty 4

## 2019-01-26 MED ORDER — FLEET ENEMA 7-19 GM/118ML RE ENEM
1.0000 | ENEMA | Freq: Once | RECTAL | Status: AC
  Administered 2019-01-26: 1 via RECTAL
  Filled 2019-01-26: qty 1

## 2019-01-26 MED ORDER — LIDOCAINE 2% (20 MG/ML) 5 ML SYRINGE
INTRAMUSCULAR | Status: AC
  Filled 2019-01-26: qty 5

## 2019-01-26 MED ORDER — ONDANSETRON HCL 4 MG/2ML IJ SOLN
INTRAMUSCULAR | Status: DC | PRN
  Administered 2019-01-26: 4 mg via INTRAVENOUS

## 2019-01-26 MED ORDER — TAMSULOSIN HCL 0.4 MG PO CAPS
0.4000 mg | ORAL_CAPSULE | Freq: Every day | ORAL | Status: DC
  Administered 2019-01-26: 0.4 mg via ORAL
  Filled 2019-01-26 (×2): qty 1

## 2019-01-26 SURGICAL SUPPLY — 49 items
BAG URINE DRAINAGE (UROLOGICAL SUPPLIES) ×5 IMPLANT
BLADE CLIPPER SENSICLIP SURGIC (BLADE) ×5 IMPLANT
CATH FOLEY 2WAY SLVR  5CC 16FR (CATHETERS) ×2
CATH FOLEY 2WAY SLVR 5CC 16FR (CATHETERS) ×3 IMPLANT
CATH ROBINSON RED A/P 16FR (CATHETERS) IMPLANT
CATH ROBINSON RED A/P 20FR (CATHETERS) ×5 IMPLANT
CLOTH BEACON ORANGE TIMEOUT ST (SAFETY) ×5 IMPLANT
CONT SPECI 4OZ STER CLIK (MISCELLANEOUS) ×10 IMPLANT
COVER BACK TABLE 60X90IN (DRAPES) ×5 IMPLANT
COVER MAYO STAND STRL (DRAPES) ×5 IMPLANT
COVER WAND RF STERILE (DRAPES) ×5 IMPLANT
DRSG TEGADERM 4X4.75 (GAUZE/BANDAGES/DRESSINGS) ×5 IMPLANT
DRSG TEGADERM 8X12 (GAUZE/BANDAGES/DRESSINGS) ×5 IMPLANT
GAUZE SPONGE 4X4 12PLY STRL (GAUZE/BANDAGES/DRESSINGS) ×5 IMPLANT
GLOVE BIO SURGEON STRL SZ 6 (GLOVE) IMPLANT
GLOVE BIO SURGEON STRL SZ 6.5 (GLOVE) IMPLANT
GLOVE BIO SURGEON STRL SZ7 (GLOVE) IMPLANT
GLOVE BIO SURGEON STRL SZ7.5 (GLOVE) ×10 IMPLANT
GLOVE BIO SURGEON STRL SZ8 (GLOVE) IMPLANT
GLOVE BIO SURGEONS STRL SZ 6.5 (GLOVE)
GLOVE BIOGEL PI IND STRL 6 (GLOVE) IMPLANT
GLOVE BIOGEL PI IND STRL 6.5 (GLOVE) IMPLANT
GLOVE BIOGEL PI IND STRL 7.0 (GLOVE) IMPLANT
GLOVE BIOGEL PI IND STRL 8 (GLOVE) IMPLANT
GLOVE BIOGEL PI INDICATOR 6 (GLOVE)
GLOVE BIOGEL PI INDICATOR 6.5 (GLOVE)
GLOVE BIOGEL PI INDICATOR 7.0 (GLOVE)
GLOVE BIOGEL PI INDICATOR 8 (GLOVE)
GLOVE ECLIPSE 8.0 STRL XLNG CF (GLOVE) ×20 IMPLANT
GOWN STRL REUS W/ TWL LRG LVL3 (GOWN DISPOSABLE) ×3 IMPLANT
GOWN STRL REUS W/ TWL XL LVL3 (GOWN DISPOSABLE) ×3 IMPLANT
GOWN STRL REUS W/TWL LRG LVL3 (GOWN DISPOSABLE) ×2
GOWN STRL REUS W/TWL XL LVL3 (GOWN DISPOSABLE) ×7 IMPLANT
HOLDER FOLEY CATH W/STRAP (MISCELLANEOUS) ×5 IMPLANT
IMPL SPACEOAR SYSTEM 10ML (Spacer) ×3 IMPLANT
IMPLANT SPACEOAR SYSTEM 10ML (Spacer) ×5 IMPLANT
ISEED AGX100 ×390 IMPLANT
IV NS 1000ML (IV SOLUTION) ×2
IV NS 1000ML BAXH (IV SOLUTION) ×3 IMPLANT
KIT TURNOVER CYSTO (KITS) ×5 IMPLANT
MARKER SKIN DUAL TIP RULER LAB (MISCELLANEOUS) ×5 IMPLANT
PACK CYSTO (CUSTOM PROCEDURE TRAY) ×5 IMPLANT
SURGILUBE 2OZ TUBE FLIPTOP (MISCELLANEOUS) ×5 IMPLANT
SUT BONE WAX W31G (SUTURE) IMPLANT
SYR 10ML LL (SYRINGE) ×5 IMPLANT
TUBE CONNECTING 12'X1/4 (SUCTIONS)
TUBE CONNECTING 12X1/4 (SUCTIONS) IMPLANT
UNDERPAD 30X30 (UNDERPADS AND DIAPERS) ×10 IMPLANT
WATER STERILE IRR 500ML POUR (IV SOLUTION) ×5 IMPLANT

## 2019-01-26 NOTE — Op Note (Addendum)
Preoperative diagnosis: Stage IIa (T1cNxMx, PSA 10-20) Prostate cancer Postoperative diagnosis: Same  Procedure: Prostate brachytherapy seed implant, biodegradable gel insertion, Cystoscopy  Surgeon: Junious Silk  Radiation oncologist: Tammi Klippel  Anesthesia: Gen.  Indication for procedure: 68 year old with stage IIa prostate cancer who elected to proceed with prostate brachytherapy.  Findings:  On exam under anesthesia the penis was uncircumcised and without mass or lesion.  No phimosis.  Testicles descended bilaterally and palpably normal.  On digital rectal exam the prostate was about 40 g, smooth without hard area or nodule.  There was no anal or rectal mass noted.  No blood per rectum noted.  On fluoroscopic imaging there was adequate coverage of the prostate. On cystoscopy the urethra appeared normal, the prostatic urethra appeared normal, the trigone and ureteral orifices appeared normal with clear efflux. The bladder mucosa appeared normal. There were no stones, foreign bodies or seeds visualized in the bladder.  Dose:145 Gy  Description of procedure: After consent was obtained patient brought to the operating room. After adequate anesthesia he is placed in lithotomy position and the transrectal ultrasound probe and perineal template positioned. Catheters and brachytherapy seeds were placed per Dr. Johny Shears plan. A total of 27 catheters and 78 active sources (I-125) were placed. The anchoring needles and template removed.   The 18-gauge needle was then inserted approximately 1 to 2 cm anterior to the anal opening and directed under fluoroscopic guidance into the perirectal fat between the anterior rectal wall and the prostate capsule down to the mid-gland. Midline needle position was confirmed in the sagittal and axial views to verify the tip was in the perirectal fat.  Small amounts of saline were injected to hydrodissect the space between the prostate and the anterior rectal wall.   Axial imaging was viewed to confirm the needle was in the correct location in the mid gland and centered.  Aspiration confirmed no intravascular access.  The saline syringe was carefully disconnected maintaining the desired needle position and the hydrogel was attached to the needle.  Under ultrasound guidance in the sagittal view a smooth continuous injection was done over about 12 seconds delivering the hydrogel into the space between the prostate and rectal wall.  The needle was withdrawn.  The ultrasound was removed. Scout flouro imaging was obtained of the implant. The Foley was removed and another image obtained. The patient was prepped again and cystoscopy was performed which was noted to be normal. The bladder drained. He was awakened taken to the recovery room in stable condition.  Complications: None  Blood loss: Minimal  Specimens: None  Drains: None   Disposition: Patient stable to PACU.

## 2019-01-26 NOTE — Anesthesia Procedure Notes (Signed)
Procedure Name: Intubation Date/Time: 01/26/2019 7:59 AM Performed by: Brennan Bailey, MD Pre-anesthesia Checklist: Patient identified, Emergency Drugs available, Suction available and Patient being monitored Patient Re-evaluated:Patient Re-evaluated prior to induction Oxygen Delivery Method: Circle system utilized Preoxygenation: Pre-oxygenation with 100% oxygen Induction Type: IV induction Ventilation: Mask ventilation without difficulty Laryngoscope Size: Glidescope and 4 Tube type: Oral Tube size: 7.5 mm Number of attempts: 3 Airway Equipment and Method: Stylet,  Oral airway and Video-laryngoscopy Placement Confirmation: ETT inserted through vocal cords under direct vision,  positive ETCO2 and breath sounds checked- equal and bilateral Secured at: 24 cm Tube secured with: Tape Dental Injury: Teeth and Oropharynx as per pre-operative assessment  Difficulty Due To: Difficulty was unanticipated and Difficult Airway- due to anterior larynx Future Recommendations: Recommend- induction with short-acting agent, and alternative techniques readily available Comments: First attempt with LMA#5 with poor seal despite repositioning x2. Elected to paralyze and place ETT. First intubation attempt by myself with Sabra Heck #2 blade with grade 3 view, swollen glottic tissues noted with no noted trauma. (Note: pt previously noted isolated symptom of sore throat x2 days) Mask ventilated until Glidescope available. Grade 2 view with Glidescope, anterior airway noted. Atraumatic intubation after some difficulty angling ETT anterior through cords. Recommend Glidescope intubation in future with availability of fiberoptic scope if needed.

## 2019-01-26 NOTE — Transfer of Care (Signed)
Immediate Anesthesia Transfer of Care Note  Patient: Michael Rosales  Procedure(s) Performed: [name] UNDER ANESTHESIA, RADIOACTIVE SEED IMPLANT/BRACHYTHERAPY IMPLANT (N/A Prostate) SPACE OAR INSTILLATION (N/A Rectum) CYSTOSCOPY (Bladder)  Patient Location: PACU  Anesthesia Type:General  Level of Consciousness: drowsy and patient cooperative  Airway & Oxygen Therapy: Patient Spontanous Breathing and Patient connected to nasal cannula oxygen  Post-op Assessment: Report given to RN  Post vital signs: Reviewed and stable  Last Vitals: 122/74, 65, 15,92% Vitals Value Taken Time  BP    Temp    Pulse    Resp    SpO2      Last Pain:  Vitals:   01/26/19 0528  TempSrc: Oral  PainSc: 8       Patients Stated Pain Goal: 6 (04/75/33 9179)  Complications: No apparent anesthesia complications

## 2019-01-26 NOTE — Discharge Instructions (Signed)
Anemia  Anemia is a condition in which you do not have enough red blood cells or hemoglobin. Hemoglobin is a substance in red blood cells that carries oxygen. When you do not have enough red blood cells or hemoglobin (are anemic), your body cannot get enough oxygen and your organs may not work properly. As a result, you may feel very tired or have other problems. What are the causes? Common causes of anemia include:  Excessive bleeding. Anemia can be caused by excessive bleeding inside or outside the body, including bleeding from the intestine or from periods in women.  Poor nutrition.  Long-lasting (chronic) kidney, thyroid, and liver disease.  Bone marrow disorders.  Cancer and treatments for cancer.  HIV (human immunodeficiency virus) and AIDS (acquired immunodeficiency syndrome).  Treatments for HIV and AIDS.  Spleen problems.  Blood disorders.  Infections, medicines, and autoimmune disorders that destroy red blood cells. What are the signs or symptoms? Symptoms of this condition include:  Minor weakness.  Dizziness.  Headache.  Feeling heartbeats that are irregular or faster than normal (palpitations).  Shortness of breath, especially with exercise.  Paleness.  Cold sensitivity.  Indigestion.  Nausea.  Difficulty sleeping.  Difficulty concentrating. Symptoms may occur suddenly or develop slowly. If your anemia is mild, you may not have symptoms. How is this diagnosed? This condition is diagnosed based on:  Blood tests.  Your medical history.  A physical exam.  Bone marrow biopsy. Your health care provider may also check your stool (feces) for blood and may do additional testing to look for the cause of your bleeding. You may also have other tests, including:  Imaging tests, such as a CT scan or MRI.  Endoscopy.  Colonoscopy. How is this treated? Treatment for this condition depends on the cause. If you continue to lose a lot of blood, you may  need to be treated at a hospital. Treatment may include:  Taking supplements of iron, vitamin F75, or folic acid.  Taking a hormone medicine (erythropoietin) that can help to stimulate red blood cell growth.  Having a blood transfusion. This may be needed if you lose a lot of blood.  Making changes to your diet.  Having surgery to remove your spleen. Follow these instructions at home:  Take over-the-counter and prescription medicines only as told by your health care provider.  Take supplements only as told by your health care provider.  Follow any diet instructions that you were given.  Keep all follow-up visits as told by your health care provider. This is important. Contact a health care provider if:  You develop new bleeding anywhere in the body. Get help right away if:  You are very weak.  You are short of breath.  You have pain in your abdomen or chest.  You are dizzy or feel faint.  You have trouble concentrating.  You have bloody or black, tarry stools.  You vomit repeatedly or you vomit up blood. Summary  Anemia is a condition in which you do not have enough red blood cells or enough of a substance in your red blood cells that carries oxygen (hemoglobin).  Symptoms may occur suddenly or develop slowly.  If your anemia is mild, you may not have symptoms.  Be sure to follow-up with your primary care doctor: This condition is diagnosed with blood tests as well as a medical history and physical exam. Other tests may be needed.  Treatment for this condition depends on the cause of the anemia. This information is  not intended to replace advice given to you by your health care provider. Make sure you discuss any questions you have with your health care provider. Document Released: 12/23/2004 Document Revised: 12/17/2016 Document Reviewed: 12/17/2016 Elsevier Interactive Patient Education  2019 [place] for Prostate Cancer, Care After  This  sheet gives you information about how to care for yourself after your procedure. Your health care provider may also give you more specific instructions. If you have problems or questions, contact your health care provider. What can I expect after the procedure? After the procedure, it is common to have:  Trouble passing urine.  Blood in the urine or semen.  Constipation.  Frequent feeling of an urgent need to urinate.  Bruising, swelling, and tenderness of the area behind the scrotum (perineum).  Bloating and gas.  Fatigue.  Burning or pain in the rectum.  Problems getting or keeping an erection (erectile dysfunction).  Nausea. Follow these instructions at home: Managing pain, stiffness, and swelling  If directed, apply ice to the affected area: ? Put ice in a plastic bag. ? Place a towel between your skin and the bag. ? Leave the ice on for 20 minutes, 2-3 times a day.  Try not to sit directly on the area behind the scrotum. A soft cushion can help with discomfort. Activity  Do not drive for 24 hours if you were given a medicine to help you relax (sedative).  Do not drive or use heavy machinery while taking prescription pain medicine.  Rest as told by your health care provider.  Most people can return to normal activities a few days or weeks after the procedure. Ask your health care provider what activities are safe for you. Eating and drinking  Drink enough fluid to keep your urine clear or pale yellow.  Eat a healthy, balanced diet. This includes lean proteins, whole grains, and plenty of fruits and vegetables. General instructions  Take over-the-counter and prescription medicines only as told by your health care provider.  Keep all follow-up visits as told by your health care provider. This is important. You may still need additional treatment.  Do not take baths, swim, or use a hot tub until your health care provider approves. Shower and wash the area behind  the scrotum gently.  Do not have sex for one week after the treatment, or until your health care provider approves.  If you have permanent, low-dose brachytherapy implants: ? Limit close contact with children and pregnant women for 2 months or as told by your health care provider. This is important because of the radiation that is still active in the prostate. ? You may set off radioactive sensors, such as airport screenings. Ask your health care provider for a document that explains your treatment. ? You may be instructed to use a condom during sex for the first 2 months after low-dose brachytherapy. Contact a health care provider if:  You have a fever or chills.  You do not have a bowel movement for 3-4 days after the procedure.  You have diarrhea for 3-4 days after the procedure.  You develop any new symptoms, such as problems with urinating or erectile dysfunction.  You have abdomen (abdominal) pain.  You have more blood in your urine. Get help right away if:  You cannot urinate.  There is excessive bleeding from your rectum.  You have unusual drainage coming from your rectum.  You have severe pain in the treated area that does not go away with  pain medicine.  You have severe nausea or vomiting. Summary  If you have permanent, low-dose brachytherapy implants, limit close contact with children and pregnant women for 2 months or as told by your health care provider. This is important because of the radiation that is still active in the prostate.  Talk with your health care provider about your risk of brachytherapy side effects, such as erectile dysfunction or urinary problems. Your health care provider will be able to recommend possible treatment options.  Keep all follow-up visits as told by your health care provider. This is important. You may need additional treatment. This information is not intended to replace advice given to you by your health care provider. Make sure  you discuss any questions you have with your health care provider. Document Released: 12/18/2010 Document Revised: 12/17/2016 Document Reviewed: 12/17/2016 Elsevier Interactive Patient Education  2019 [employer].

## 2019-01-26 NOTE — Progress Notes (Signed)
[place] Work  Clinical Social Work was notified by Jeral Fruit, patient will be staying overnight after seed placement procedure.  CSW notified CSW Kathrin Greathouse, patient will need taxi voucher home tomorrow.  CSW notified RN on 4E , they are aware to call [place] [phone]) when patient is ready to discharge.    Gwinda Maine, LCSW  Clinical Social Worker [facility]

## 2019-01-26 NOTE — Interval H&P Note (Signed)
History and Physical Interval Note:  01/26/2019 7:39 AM  Michael Rosales  has presented today for surgery, with the diagnosis of PROSTATE CANCER  The various methods of treatment have been discussed with the patient and family. After consideration of risks, benefits and other options for treatment, the patient has consented to  Procedure(s): RADIOACTIVE SEED IMPLANT/BRACHYTHERAPY IMPLANT (N/A) SPACE OAR INSTILLATION (N/A) as a surgical intervention .  The patient's history has been reviewed, patient examined, no change in status, stable for surgery. Hgb was 7.2 on pre-op labs but 8.8 today. He has no bloody stools and has not seen any other bleeding. AFVSS. Sore throat x 2 days. No cough or congestion. No fever or chills.  I have reviewed the patient's chart and labs.  Questions were answered to the patient's satisfaction.  He elects to proceed.    Festus Aloe

## 2019-01-26 NOTE — Anesthesia Postprocedure Evaluation (Signed)
Anesthesia Post Note  Patient: Michael Rosales  Procedure(s) Performed: [name] UNDER ANESTHESIA, RADIOACTIVE SEED IMPLANT/BRACHYTHERAPY IMPLANT (N/A Prostate) SPACE OAR INSTILLATION (N/A Rectum) CYSTOSCOPY (Bladder)     Patient location during evaluation: PACU Anesthesia Type: General Level of consciousness: awake and alert Pain management: pain level controlled Vital Signs Assessment: post-procedure vital signs reviewed and stable Respiratory status: spontaneous breathing, nonlabored ventilation and respiratory function stable Cardiovascular status: blood pressure returned to baseline and stable Postop Assessment: no apparent nausea or vomiting Anesthetic complications: no    Last Vitals:  Vitals:   01/26/19 1000 01/26/19 1015  BP: 115/74 113/76  Pulse: 60 60  Resp: 14 17  Temp:    SpO2: 95% 91%    Last Pain:  Vitals:   01/26/19 1015  TempSrc:   PainSc: 0-No pain                 Brennan Bailey

## 2019-01-26 NOTE — Progress Notes (Signed)
  Radiation Oncology         (336) [phone] ________________________________  Name: Michael Rosales MRN: [id number]  Date: 01/26/2019  DOB: 10/21/1951       Prostate Seed Implant  IX:VEZBM, Aurora Mask, FNP  No ref. provider found  DIAGNOSIS: 68 y.o. gentleman with low risk, Stage T1c adenocarcinoma of the prostate with Gleason Score of 3+3, and PSA of 7  PROCEDURE: Insertion of radioactive I-125 seeds into the prostate gland.  RADIATION DOSE: 145 Gy, definitive therapy.  TECHNIQUE: Michael Rosales was brought to the operating room with the urologist. He was placed in the dorsolithotomy position. He was catheterized and a rectal tube was inserted. The perineum was shaved, prepped and draped. The ultrasound probe was then introduced into the rectum to see the prostate gland.  TREATMENT DEVICE: A needle grid was attached to the ultrasound probe stand and anchor needles were placed.  3D PLANNING: The prostate was imaged in 3D using a sagittal sweep of the prostate probe. These images were transferred to the planning computer. There, the prostate, urethra and rectum were defined on each axial reconstructed image. Then, the software created an optimized 3D plan and a few seed positions were adjusted. The quality of the plan was reviewed using [facility] information for the target and the following two organs at risk:  Urethra and Rectum.  Then the accepted plan was printed and handed off to the radiation therapist.  Under my supervision, the custom loading of the seeds and spacers was carried out and loaded into sealed vicryl sleeves.  These pre-loaded needles were then placed into the needle holder.Marland Kitchen  PROSTATE VOLUME STUDY:  Using transrectal ultrasound the volume of the prostate was verified to be 56.7 cc.  SPECIAL TREATMENT PROCEDURE/SUPERVISION AND HANDLING: The pre-loaded needles were then delivered under sagittal guidance. A total of 27 needles were used to deposit 78 seeds in the prostate gland.  The individual seed activity was 0.526 mCi.  SpaceOAR:  Yes  COMPLEX SIMULATION: At the end of the procedure, an anterior radiograph of the pelvis was obtained to document seed positioning and count. Cystoscopy was performed to check the urethra and bladder.  MICRODOSIMETRY: At the end of the procedure, the patient was emitting 0.187 mR/hr at 1 meter. Accordingly, he was considered safe for hospital discharge.  PLAN: The patient will return to the radiation oncology clinic for post implant CT dosimetry in three weeks.   ________________________________  Michael Rosales, M.D.

## 2019-01-27 DIAGNOSIS — C61 Malignant neoplasm of prostate: Secondary | ICD-10-CM | POA: Diagnosis not present

## 2019-01-27 LAB — HIV ANTIBODY (ROUTINE TESTING W REFLEX): HIV Screen 4th Generation wRfx: NONREACTIVE

## 2019-01-27 NOTE — Discharge Summary (Signed)
Date of admission: 01/26/2019  Date of discharge: 01/27/2019  Admission diagnosis: Gleason 6 prostate cancer  Discharge diagnosis: Same  Procedures: Brachytherapy seed and space oar placement  History and Physical: For full details, please see admission history and physical. Briefly, Michael Rosales is a 68 y.o. year old patient with Gleason 6 prostate cancer status post brachytherapy seed and space oar placement on 01/27/2019 with Dr. Junious Silk.   Hospital Course: The patient was monitored on the floor overnight with no acute events.  He is voiding without difficulty and denies dysuria or gross hematuria.  Physical Exam:  General: Alert and oriented CV: RRR, palpable distal pulses Lungs: CTAB, equal chest rise Abdomen: Soft, NTND, no rebound or guarding Ext: NT, No erythema  Laboratory values:  Recent Labs    01/26/19 0730  HGB 8.8*  HCT 26.0*   No results for input(s): CREATININE in the last 72 hours.  Disposition: Home  Discharge instruction: The patient was instructed to be ambulatory but told to refrain from heavy lifting, strenuous activity, or driving.  Discharge medications:  Allergies as of 01/27/2019   No Known Allergies     Medication List    STOP taking these medications   ALKA-SELTZER PO     TAKE these medications   busPIRone 10 MG tablet Commonly known as:  BUSPAR Take 20 mg by mouth 2 (two) times daily.   citalopram 40 MG tablet Commonly known as:  CELEXA Take 40 mg by mouth every morning.   finasteride 5 MG tablet Commonly known as:  PROSCAR Take 5 mg by mouth daily.   gabapentin 300 MG capsule Commonly known as:  NEURONTIN Take 300 mg by mouth 2 (two) times daily.   ibuprofen 800 MG tablet Commonly known as:  ADVIL,MOTRIN Take 800 mg by mouth every 8 (eight) hours as needed for moderate pain.   mirtazapine 15 MG tablet Commonly known as:  REMERON Take 15 mg by mouth at bedtime.   tamsulosin 0.4 MG Caps capsule Commonly known as:   FLOMAX Take 1 capsule (0.4 mg total) by mouth daily after supper.   traZODone 150 MG tablet Commonly known as:  DESYREL Take 150 mg by mouth at bedtime as needed for sleep.       Followup:  Follow-up Information    Festus Aloe, MD On 02/09/2019.   Specialty:  Urology Why:  8 AM Contact information: [place]  [address] [phone]

## 2019-01-27 NOTE — Plan of Care (Signed)
Discharge instructions reviewed with patient, questions answered, verbalized understanding.  Patient transported to main entrance to be taken to [facility] by taxi (taxi voucher from [place]).

## 2019-01-27 NOTE — Progress Notes (Signed)
CSW provided the patient with a taxi voucher home.   Kathrin Greathouse, Marlinda Mike, MSW Clinical Social Worker  [phone] 01/27/2019  9:26 AM

## 2019-01-29 ENCOUNTER — Encounter (HOSPITAL_BASED_OUTPATIENT_CLINIC_OR_DEPARTMENT_OTHER): Payer: Self-pay | Admitting: Urology

## 2019-02-04 ENCOUNTER — Telehealth: Payer: Self-pay

## 2019-02-04 NOTE — Telephone Encounter (Signed)
Left message to call about [employer] program. I call social worker for information.

## 2019-02-04 NOTE — Congregational Nurse Program (Signed)
Pt was alert and complained of some knee pain. Pt states he was taking meds as prescribed. No signs of distress observed today. Pt states other team members were able to obtain temporary housing for him starting Sunday 02/04/2019. The [employer]OP[employer] termination agreement was signed for 02/05/2019. Bus passes were giving to aid in his move. The SCAT application will be submitted this week for Mr. Michael Rosales.

## 2019-02-04 NOTE — Telephone Encounter (Signed)
Left message to schedule visit.

## 2019-02-04 NOTE — Telephone Encounter (Signed)
Spoke with Michael Rosales today follow up to his procedure last week. Pt states he was feeling bad especially his knee. Pt states he was taking his meds as prescribed. Pt states he would be looking at more housing over the week. I spoke with social worker about SCAT application.

## 2019-02-04 NOTE — Telephone Encounter (Signed)
Spoke with Michael Rosales to schedule a visit for Saturday 02/03/2019 and to advised him that the [employer]OPES program would terminate Monday the 02/05/2019. Pt stated he needed more time to arrange moving help and a temporary  place until he could find permament housing. I advised pt to call  team  Members  for advise.

## 2019-02-06 ENCOUNTER — Telehealth: Payer: Self-pay | Admitting: *Deleted

## 2019-02-06 NOTE — Telephone Encounter (Signed)
Called patient to remind of post seed appts. for 02-07-19 and his MRI for 02-08-19- arrival time - 6:30 pm @ [facility], no restrictions to test, test to begin @ 7 pm, spoke with patient and he is awware of these appts.

## 2019-02-07 ENCOUNTER — Other Ambulatory Visit: Payer: Self-pay

## 2019-02-07 ENCOUNTER — Ambulatory Visit
Admission: RE | Admit: 2019-02-07 | Discharge: 2019-02-07 | Disposition: A | Discharge status: Home / Self Care | Payer: Medicare Other | Source: Ambulatory Visit | Attending: Radiation Oncology | Admitting: Radiation Oncology

## 2019-02-07 ENCOUNTER — Ambulatory Visit
Admission: RE | Admit: 2019-02-07 | Discharge: 2019-02-07 | Disposition: A | Discharge status: Home / Self Care | Payer: Medicare Other | Source: Ambulatory Visit | Attending: Urology | Admitting: Urology

## 2019-02-07 ENCOUNTER — Encounter: Payer: Self-pay | Admitting: Urology

## 2019-02-07 VITALS — BP 115/69 | HR 84 | Temp 98.7°F | Resp 20 | Ht 74.0 in | Wt 202.6 lb

## 2019-02-07 DIAGNOSIS — Z79899 Other long term (current) drug therapy: Secondary | ICD-10-CM | POA: Diagnosis not present

## 2019-02-07 DIAGNOSIS — R3911 Hesitancy of micturition: Secondary | ICD-10-CM | POA: Insufficient documentation

## 2019-02-07 DIAGNOSIS — R350 Frequency of micturition: Secondary | ICD-10-CM | POA: Insufficient documentation

## 2019-02-07 DIAGNOSIS — C61 Malignant neoplasm of prostate: Secondary | ICD-10-CM | POA: Diagnosis not present

## 2019-02-07 DIAGNOSIS — Z923 Personal history of irradiation: Secondary | ICD-10-CM | POA: Insufficient documentation

## 2019-02-07 DIAGNOSIS — F17210 Nicotine dependence, cigarettes, uncomplicated: Secondary | ICD-10-CM | POA: Diagnosis not present

## 2019-02-07 NOTE — Progress Notes (Signed)
Radiation Oncology         [phone]) [phone] ________________________________  Name: Michael Rosales MRN: [id number]  Date: 02/07/2019  DOB: May 09, 1951  Post-Seed Follow-Up Visit Note  CC: Michael Mainland, FNP  Festus Aloe, MD  Diagnosis:   68 y.o. gentleman with low risk, Stage T1c adenocarcinoma of the prostate with Gleason Score of 3+3, and PSA of 7    ICD-10-CM   1. Malignant neoplasm of prostate ([place]) C61     Interval Since Last Radiation:  1.5 weeks 01/26/19:  Insertion of radioactive I-125 seeds into the prostate gland; 145 Gy, definitive therapy.  Narrative:  The patient returns today for routine follow-up.  He is complaining of increased urinary frequency and urinary hesitation symptoms. He filled out a questionnaire regarding urinary function today providing and overall IPSS score of 10 characterizing his symptoms as mild-moderate.  His pre-implant score was 10.  He specifically denies dysuria, fever, chills or night sweats.  He reports increased frequency and urgency with occasional small volume urge incontinence if he postpones voining for too long.  He also reports occasional scant hematuria/pink coloration to his first morning void but this clears throughout the day.  He denies any significant fatigue, abdominal pain, nausea, vomiting, diarrhea or constipation. In general, he is quite pleased with his progress to date.  ALLERGIES:  has No Known Allergies.  Meds: Current Outpatient Medications  Medication Sig Dispense Refill  . busPIRone (BUSPAR) 10 MG tablet Take 20 mg by mouth 2 (two) times daily.     . citalopram (CELEXA) 40 MG tablet Take 40 mg by mouth every morning.     . finasteride (PROSCAR) 5 MG tablet Take 5 mg by mouth daily.    Marland Kitchen gabapentin (NEURONTIN) 300 MG capsule Take 300 mg by mouth 2 (two) times daily.    Marland Kitchen ibuprofen (ADVIL,MOTRIN) 800 MG tablet Take 800 mg by mouth every 8 (eight) hours as needed for moderate pain.    . mirtazapine (REMERON) 15  MG tablet Take 15 mg by mouth at bedtime.     . tamsulosin (FLOMAX) 0.4 MG CAPS capsule Take 1 capsule (0.4 mg total) by mouth daily after supper. 45 capsule 0  . traZODone (DESYREL) 150 MG tablet Take 150 mg by mouth at bedtime as needed for sleep.     No current facility-administered medications for this visit.     Physical Findings: In general this is a well appearing African-American male in no acute distress.  He's alert and oriented x4 and appropriate throughout the examination. Cardiopulmonary assessment is negative for acute distress and he exhibits normal effort.   Lab Findings: Lab Results  Component Value Date   WBC 4.1 01/19/2019   HGB 8.8 (L) 01/26/2019   HCT 26.0 (L) 01/26/2019   MCV 70.9 (L) 01/19/2019   PLT 288 01/19/2019    Radiographic Findings:  Patient underwent CT imaging in our clinic for post implant dosimetry. The CT will be reviewed by Dr. Tammi Klippel to ensure an adequate distribution of radioactive seeds throughout the prostate gland and confirm that there are no seeds in or near the rectum.  He is scheduled for an MRI prostate on 02/08/2019 and these images will be fused with his CT images for further evaluation.  We suspect the final radiation plan and dosimetry will show appropriate coverage of the prostate gland.   Impression/Plan: 68 y.o. gentleman with low risk, Stage T1c adenocarcinoma of the prostate with Gleason Score of 3+3, and PSA of 7. The patient  is recovering from the effects of radiation. His urinary symptoms should gradually improve over the next 4-6 months. We talked about this today. He is encouraged by his improvement already and is otherwise pleased with his outcome. We also talked about long-term follow-up for prostate cancer following seed implant. He understands that ongoing PSA determinations and digital rectal exams will help perform surveillance to rule out disease recurrence. He has a follow up appointment scheduled with Jiles Crocker, NP on  02/09/19 and anticipates a follow-up visit with Dr. Junious Silk in approximately 3 months to evaluate his PSA response to treatment. He understands what to expect with his PSA measures. Patient was also educated today about some of the long-term effects from radiation including a small risk for rectal bleeding and possibly erectile dysfunction. We talked about some of the general management approaches to these potential complications. However, I did encourage the patient to contact our office or return at any point if he has questions or concerns related to his previous radiation and prostate cancer.    Nicholos Johns, PA-C

## 2019-02-07 NOTE — Progress Notes (Signed)
Pt here today for a Post seed appointment. Pt states that he has an MRI scheduled for tomorrow and an appointment with [facility] on Friday the 13th. Pt denies having any dysuria. Pt states having a small amount of hematuria. Pt denies having any leakage. Pt states that he is having an issue with urgency and has difficulty holding his urine.  BP 115/69 (BP Location: Left Arm, Patient Position: Sitting)   Pulse 84   Temp 98.7 F (37.1 C) (Oral)   Resp 20   Ht 6\' 2"  (1.88 m)   Wt 202 lb 9.6 oz (91.9 kg)   SpO2 98%   BMI 26.01 kg/m    Wt Readings from Last 3 Encounters:  02/07/19 202 lb 9.6 oz (91.9 kg)  01/26/19 215 lb 2 oz (97.6 kg)  11/13/18 192 lb 6 oz (87.3 kg)

## 2019-02-08 ENCOUNTER — Ambulatory Visit (HOSPITAL_COMMUNITY): Admission: RE | Admit: 2019-02-08 | Payer: Medicare Other | Source: Ambulatory Visit

## 2019-02-09 ENCOUNTER — Telehealth: Payer: Self-pay | Admitting: *Deleted

## 2019-02-09 ENCOUNTER — Ambulatory Visit (HOSPITAL_COMMUNITY): Payer: Medicare Other

## 2019-02-09 NOTE — Progress Notes (Signed)
  Radiation Oncology         (336) [phone] ________________________________  Name: Michael Rosales MRN: [id number]  Date: 02/07/2019  DOB: 1951-01-26  COMPLEX SIMULATION NOTE  NARRATIVE:  The patient was brought to the [facility] today following prostate seed implantation approximately one month ago.  Identity was confirmed.  All relevant records and images related to the planned course of therapy were reviewed.  Then, the patient was set-up supine.  CT images were obtained.  The CT images were loaded into the planning software.  Then the prostate and rectum were contoured.  Treatment planning then occurred.  The implanted iodine 125 seeds were identified by the physics staff for projection of radiation distribution  I have requested : 3D Simulation  I have requested a DVH of the following structures: Prostate and rectum.    ________________________________  Sheral Apley Tammi Klippel, M.D.

## 2019-02-09 NOTE — Telephone Encounter (Signed)
Called patient to inform of MRI being moved to 02-20-19 - arrival time- 12:30 pm @ [facility], appt. moved due to patient having transpo issues, patient verified knowing about these appts.

## 2019-02-13 ENCOUNTER — Encounter: Payer: Self-pay | Admitting: Radiation Oncology

## 2019-02-13 DIAGNOSIS — C61 Malignant neoplasm of prostate: Secondary | ICD-10-CM | POA: Diagnosis not present

## 2019-02-15 NOTE — Progress Notes (Signed)
error 

## 2019-02-20 ENCOUNTER — Ambulatory Visit (HOSPITAL_COMMUNITY): Admission: RE | Admit: 2019-02-20 | Payer: Medicare Other | Source: Ambulatory Visit

## 2019-02-20 NOTE — Telephone Encounter (Signed)
Completed, left message.

## 2019-02-25 NOTE — Progress Notes (Signed)
  Radiation Oncology         [phone] ________________________________  Name: Michael Rosales MRN: [id number]  Date: 02/13/2019  DOB: [birth year]  3D Planning Note   Prostate Brachytherapy Post-Implant Dosimetry  Diagnosis: 68 y.o. gentleman with low risk, Stage T1c adenocarcinoma of the prostate with Gleason Score of 3+3, and PSA of 7  Narrative: On a previous date, Michael Rosales returned following prostate seed implantation for post implant planning. He underwent CT scan complex simulation to delineate the three-dimensional structures of the pelvis and demonstrate the radiation distribution.  Since that time, the seed localization, and complex isodose planning with dose volume histograms have now been completed.  Results:   Prostate Coverage - The dose of radiation delivered to the 90% or more of the prostate gland (D90) was 114% of the prescription dose. This exceeds our goal of greater than 90%. Rectal Sparing - The volume of rectal tissue receiving the prescription dose or higher was 0.24 cc. This falls under our thresholds tolerance of 1.0 cc.  Impression: The prostate seed implant appears to show adequate target coverage and appropriate rectal sparing.  Plan:  The patient will continue to follow with urology for ongoing PSA determinations. I would anticipate a high likelihood for local tumor control with minimal risk for rectal morbidity.  ________________________________  Sheral Apley Tammi Klippel, M.D.

## 2019-03-06 ENCOUNTER — Other Ambulatory Visit: Payer: Self-pay

## 2019-03-06 ENCOUNTER — Ambulatory Visit (HOSPITAL_COMMUNITY)
Admission: RE | Admit: 2019-03-06 | Discharge: 2019-03-06 | Disposition: A | Discharge status: Home / Self Care | Payer: Medicare Other | Source: Ambulatory Visit | Attending: Urology | Admitting: Urology

## 2019-03-06 DIAGNOSIS — C61 Malignant neoplasm of prostate: Secondary | ICD-10-CM | POA: Diagnosis present

## 2020-01-25 ENCOUNTER — Ambulatory Visit: Payer: Medicare Other | Attending: Internal Medicine

## 2020-01-25 DIAGNOSIS — Z23 Encounter for immunization: Secondary | ICD-10-CM

## 2020-01-25 NOTE — Progress Notes (Signed)
   Covid-19 Vaccination Clinic  Name:  Michael Rosales    MRN: [id number] DOB: 1951-04-22  01/25/2020  Mr. Michael Rosales was observed post Covid-19 immunization for 15 minutes without incidence. He was provided with Vaccine Information Sheet and instruction to access the V-Safe system.   Mr. Michael Rosales was instructed to call 911 with any severe reactions post vaccine: Marland Kitchen Difficulty breathing  . Swelling of your face and throat  . A fast heartbeat  . A bad rash all over your body  . Dizziness and weakness    Immunizations Administered    Name Date Dose VIS Date Route   Pfizer COVID-19 Vaccine 01/25/2020  1:08 PM 0.3 mL 11/09/2019 Intramuscular   Manufacturer: [place]   Lot: [id number]   [place]: [id number]

## 2020-02-19 ENCOUNTER — Ambulatory Visit: Payer: Medicare Other | Attending: Internal Medicine

## 2020-02-19 DIAGNOSIS — Z23 Encounter for immunization: Secondary | ICD-10-CM

## 2020-02-19 NOTE — Progress Notes (Signed)
   Covid-19 Vaccination Clinic  Name:  Michael Rosales    MRN: [id number] DOB: 12/05/1950  02/19/2020  [name] was observed post Covid-19 immunization for 15 minutes without incident. He was provided with Vaccine Information Sheet and instruction to access the V-Safe system.   [name] was instructed to call 911 with any severe reactions post vaccine: Marland Kitchen Difficulty breathing  . Swelling of face and throat  . A fast heartbeat  . A bad rash all over body  . Dizziness and weakness   Immunizations Administered    Name Date Dose VIS Date Route   Pfizer COVID-19 Vaccine 02/19/2020  4:09 PM 0.3 mL 11/09/2019 Intramuscular   Manufacturer: [place]   Lot: [id number]   [place]: [id number]
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
## 2020-07-08 NOTE — Patient Instructions (Addendum)
DUE TO COVID-19 ONLY ONE VISITOR IS ALLOWED TO COME WITH YOU AND STAY IN THE WAITING ROOM ONLY DURING PRE  OP AND  PROCEDURE.   IF YOU WILL BE ADMITTED INTO THE HOSPITAL YOU ARE ALLOWED ONE SUPPORT PERSON DURING VISITATION HOURS ONLY  (10AM -8PM)   . The support person may change daily. . The support person must pass our screening, gel in and out, and wear a mask at all times, including in the patient's room. . Patients must also wear a mask when staff or their support person are in the room.   COVID SWAB TESTING MUST BE COMPLETED ON:  Friday, 07-18-2020 @ 9:30 AM    [address] [place], [place] [address]  (Must self quarantine after testing. Follow instructions on handout.)    Your procedure is scheduled on:  Tuesday, 07-22-2020   Report to [facility] Main  Entrance      Report to admitting at 7:45 AM   Call this number if you have problems the morning of surgery [phone]   Do not eat food :After Midnight.   May have liquids until 7:00 AM  day of surgery  CLEAR LIQUID DIET  Foods Allowed                                                                     Foods Excluded Water, Black Coffee and tea, regular and decaf              liquids that you cannot  Plain Jell-O in any flavor  (No red)                                     see through such as: Fruit ices (not with fruit pulp)                                      milk, soups, orange juice              Iced Popsicles (No red)                                      All solid food                                   Apple juices Sports drinks like Gatorade (No red) Lightly seasoned clear broth or consume(fat free) Sugar, honey syrup    Complete one Ensure drink the morning of surgery at 7:00 AM the day of surgery.     Oral Hygiene is also important to reduce your risk of infection.                                     Remember - BRUSH YOUR TEETH THE MORNING OF SURGERY WITH YOUR REGULAR TOOTHPASTE   Do NOT smoke  after Midnight   Take  these medicines the morning of surgery with A SIP OF WATER: Buspirone, Citalopram, Esomeprazole, Omeprazole, Tramadol                                You may not have any metal on your body including jewelry, and body piercings               Do not wear make-up, lotions, powders, perfumes/cologne, or deodorant                       Men may shave face and neck.   Do not bring valuables to the hospital. [place].   Contacts, dentures or bridgework may not be worn into surgery.     Patients discharged the day of surgery will not be allowed to drive home.                Please read over the following fact sheets you were given: IF YOU HAVE QUESTIONS ABOUT YOUR PRE OP INSTRUCTIONS   PLEASE CALL  [phone]   [place] - Preparing for Surgery Before surgery, you can play an important role.  Because skin is not sterile, your skin needs to be as free of germs as possible.  You can reduce the number of germs on your skin by washing with CHG (chlorahexidine gluconate) soap before surgery.  CHG is an antiseptic cleaner which kills germs and bonds with the skin to continue killing germs even after washing. Please DO NOT use if you have an allergy to CHG or antibacterial soaps.  If your skin becomes reddened/irritated stop using the CHG and inform your nurse when you arrive at Short Stay. Do not shave (including legs and underarms) for at least 48 hours prior to the first CHG shower.  You may shave your face/neck.  Please follow these instructions carefully:  1.  Shower with CHG Soap the night before surgery and the  morning of surgery.  2.  If you choose to wash your hair, wash your hair first as usual with your normal  shampoo.  3.  After you shampoo, rinse your hair and body thoroughly to remove the shampoo.                             4.  Use CHG as you would any other liquid soap.  You can apply chg directly to the skin and wash.   Gently with a scrungie or clean washcloth.  5.  Apply the CHG Soap to your body ONLY FROM THE NECK DOWN.   Do   not use on face/ open                           Wound or open sores. Avoid contact with eyes, ears mouth and   genitals (private parts).                       Wash face,  Genitals (private parts) with your normal soap.             6.  Wash thoroughly, paying special attention to the area where your    surgery  will be performed.  7.  Thoroughly rinse your body with warm water from the neck down.  8.  DO NOT  shower/wash with your normal soap after using and rinsing off the CHG Soap.                9.  Pat yourself dry with a clean towel.            10.  Wear clean pajamas.            11.  Place clean sheets on your bed the night of your first shower and do not  sleep with pets. Day of Surgery : Do not apply any lotions/deodorants the morning of surgery.  Please wear clean clothes to the hospital/surgery center.  FAILURE TO FOLLOW THESE INSTRUCTIONS MAY RESULT IN THE CANCELLATION OF YOUR SURGERY  PATIENT SIGNATURE_________________________________  NURSE SIGNATURE__________________________________  ________________________________________________________________________   Michael Rosales  An incentive spirometer is a tool that can help keep your lungs clear and active. This tool measures how well you are filling your lungs with each breath. Taking long deep breaths may help reverse or decrease the chance of developing breathing (pulmonary) problems (especially infection) following:  A long period of time when you are unable to move or be active. BEFORE THE PROCEDURE   If the spirometer includes an indicator to show your best effort, your nurse or respiratory therapist will set it to a desired goal.  If possible, sit up straight or lean slightly forward. Try not to slouch.  Hold the incentive spirometer in an upright position. INSTRUCTIONS FOR USE  1. Sit on the edge of  your bed if possible, or sit up as far as you can in bed or on a chair. 2. Hold the incentive spirometer in an upright position. 3. Breathe out normally. 4. Place the mouthpiece in your mouth and seal your lips tightly around it. 5. Breathe in slowly and as deeply as possible, raising the piston or the ball toward the top of the column. 6. Hold your breath for 3-5 seconds or for as long as possible. Allow the piston or ball to fall to the bottom of the column. 7. Remove the mouthpiece from your mouth and breathe out normally. 8. Rest for a few seconds and repeat Steps 1 through 7 at least 10 times every 1-2 hours when you are awake. Take your time and take a few normal breaths between deep breaths. 9. The spirometer may include an indicator to show your best effort. Use the indicator as a goal to work toward during each repetition. 10. After each set of 10 deep breaths, practice coughing to be sure your lungs are clear. If you have an incision (the cut made at the time of surgery), support your incision when coughing by placing a pillow or rolled up towels firmly against it. Once you are able to get out of bed, walk around indoors and cough well. You may stop using the incentive spirometer when instructed by your caregiver.  RISKS AND COMPLICATIONS  Take your time so you do not get dizzy or light-headed.  If you are in pain, you may need to take or ask for pain medication before doing incentive spirometry. It is harder to take a deep breath if you are having pain. AFTER USE  Rest and breathe slowly and easily.  It can be helpful to keep track of a log of your progress. Your caregiver can provide you with a simple table to help with this. If you are using the spirometer at home, follow these instructions: [place] IF:   You are having difficultly using  the spirometer.  You have trouble using the spirometer as often as instructed.  Your pain medication is not giving enough relief  while using the spirometer.  You develop fever of 100.5 F (38.1 C) or higher. SEEK IMMEDIATE MEDICAL CARE IF:   You cough up bloody sputum that had not been present before.  You develop fever of 102 F (38.9 C) or greater.  You develop worsening pain at or near the incision site. MAKE SURE YOU:   Understand these instructions.  Will watch your condition.  Will get help right away if you are not doing well or get worse. Document Released: 03/28/2007 Document Revised: 02/07/2012 Document Reviewed: 05/29/2007 [facility] Patient Information 2014 [place], [place].   ________________________________________________________________________

## 2020-07-08 NOTE — Progress Notes (Addendum)
COVID Vaccine Completed: x2 Date COVID Vaccine completed:01-25-20 & 02-19-20 COVID vaccine manufacturer: [place]   PCP - Willey Blade, FNP Cardiologist - N/A  Medical Clearance and Urology Clearance on chart  Chest x-ray -  EKG - 06-30-2020 on chart Stress Test -  ECHO -  Cardiac Cath -   Sleep Study -  CPAP -   Fasting Blood Sugar -  Checks Blood Sugar _____ times a day  Blood Thinner Instructions: Aspirin Instructions: Last Dose:  Anesthesia review: Hx of difficult airway  Patient denies shortness of breath, fever, cough and chest pain at PAT appointment   Patient verbalized understanding of instructions that were given to them at the PAT appointment. Patient was also instructed that they will need to review over the PAT instructions again at home before surgery.

## 2020-07-15 ENCOUNTER — Encounter (HOSPITAL_COMMUNITY)
Admission: RE | Admit: 2020-07-15 | Discharge: 2020-07-15 | Disposition: A | Discharge status: Home / Self Care | Payer: Medicare Other | Source: Ambulatory Visit | Attending: Orthopedic Surgery | Admitting: Orthopedic Surgery

## 2020-07-15 ENCOUNTER — Encounter (HOSPITAL_COMMUNITY): Payer: Self-pay

## 2020-07-15 ENCOUNTER — Other Ambulatory Visit: Payer: Self-pay

## 2020-07-15 ENCOUNTER — Other Ambulatory Visit (HOSPITAL_COMMUNITY): Payer: Self-pay | Admitting: *Deleted

## 2020-07-15 DIAGNOSIS — Z01812 Encounter for preprocedural laboratory examination: Secondary | ICD-10-CM | POA: Insufficient documentation

## 2020-07-15 DIAGNOSIS — Z79899 Other long term (current) drug therapy: Secondary | ICD-10-CM | POA: Insufficient documentation

## 2020-07-15 DIAGNOSIS — F1011 Alcohol abuse, in remission: Secondary | ICD-10-CM | POA: Insufficient documentation

## 2020-07-15 DIAGNOSIS — R351 Nocturia: Secondary | ICD-10-CM | POA: Insufficient documentation

## 2020-07-15 DIAGNOSIS — R8271 Bacteriuria: Secondary | ICD-10-CM | POA: Diagnosis not present

## 2020-07-15 DIAGNOSIS — Z8546 Personal history of malignant neoplasm of prostate: Secondary | ICD-10-CM | POA: Insufficient documentation

## 2020-07-15 DIAGNOSIS — F419 Anxiety disorder, unspecified: Secondary | ICD-10-CM | POA: Insufficient documentation

## 2020-07-15 DIAGNOSIS — F17210 Nicotine dependence, cigarettes, uncomplicated: Secondary | ICD-10-CM | POA: Insufficient documentation

## 2020-07-15 DIAGNOSIS — Z791 Long term (current) use of non-steroidal anti-inflammatories (NSAID): Secondary | ICD-10-CM | POA: Insufficient documentation

## 2020-07-15 DIAGNOSIS — M1711 Unilateral primary osteoarthritis, right knee: Secondary | ICD-10-CM | POA: Insufficient documentation

## 2020-07-15 HISTORY — DX: Cardiac murmur, unspecified: R01.1

## 2020-07-15 LAB — BASIC METABOLIC PANEL
Anion gap: 8 (ref 5–15)
BUN: 12 mg/dL (ref 8–23)
CO2: 22 mmol/L (ref 22–32)
Calcium: 8.4 mg/dL — ABNORMAL LOW (ref 8.9–10.3)
Chloride: 103 mmol/L (ref 98–111)
Creatinine, Ser: 1.22 mg/dL (ref 0.61–1.24)
GFR calc Af Amer: >60 mL/min (ref >60)
GFR calc non Af Amer: >60 mL/min (ref >60)
Glucose, Bld: 107 mg/dL — ABNORMAL HIGH (ref 70–99)
Potassium: 4.6 mmol/L (ref 3.5–5.1)
Sodium: 133 mmol/L — ABNORMAL LOW (ref 135–145)

## 2020-07-15 LAB — URINALYSIS, ROUTINE W REFLEX MICROSCOPIC
Bilirubin Urine: NEGATIVE
Cellular Cast, UA: 9
Glucose, UA: NEGATIVE mg/dL
Hgb urine dipstick: NEGATIVE
Ketones, ur: NEGATIVE mg/dL
Nitrite: NEGATIVE
Protein, ur: 100 mg/dL — AB
Specific Gravity, Urine: 1.018 (ref 1.005–1.030)
WBC, UA: >50 WBC/hpf — ABNORMAL HIGH (ref 0–5)
pH: 5 (ref 5.0–8.0)

## 2020-07-15 LAB — CBC
HCT: 32.2 % — ABNORMAL LOW (ref 39.0–52.0)
Hemoglobin: 9.3 g/dL — ABNORMAL LOW (ref 13.0–17.0)
MCH: 21.7 pg — ABNORMAL LOW (ref 26.0–34.0)
MCHC: 28.9 g/dL — ABNORMAL LOW (ref 30.0–36.0)
MCV: 75.2 fL — ABNORMAL LOW (ref 80.0–100.0)
Platelets: 419 10*3/uL — ABNORMAL HIGH (ref 150–400)
RBC: 4.28 MIL/uL (ref 4.22–5.81)
RDW: 22 % — ABNORMAL HIGH (ref 11.5–15.5)
WBC: 10.1 10*3/uL (ref 4.0–10.5)
nRBC: 0 % (ref 0.0–0.2)

## 2020-07-15 LAB — SURGICAL PCR SCREEN
MRSA, PCR: NEGATIVE
Staphylococcus aureus: POSITIVE — AB

## 2020-07-15 LAB — PROTIME-INR
INR: 1.1 (ref 0.8–1.2)
Prothrombin Time: 13.3 seconds (ref 11.4–15.2)

## 2020-07-15 NOTE — Progress Notes (Addendum)
CBC, UA and PCR results sent to Dr. Percell Miller for review

## 2020-07-16 ENCOUNTER — Ambulatory Visit (HOSPITAL_COMMUNITY)
Admission: RE | Admit: 2020-07-16 | Discharge: 2020-07-16 | Disposition: A | Discharge status: Home / Self Care | Payer: Medicare Other | Source: Ambulatory Visit | Attending: Nurse Practitioner | Admitting: Nurse Practitioner

## 2020-07-16 DIAGNOSIS — D649 Anemia, unspecified: Secondary | ICD-10-CM | POA: Insufficient documentation

## 2020-07-16 LAB — ABO/RH: ABO/RH(D): O POS

## 2020-07-16 LAB — PREPARE RBC (CROSSMATCH)

## 2020-07-16 MED ORDER — DIPHENHYDRAMINE HCL 25 MG PO CAPS
25.0000 mg | ORAL_CAPSULE | Freq: Once | ORAL | Status: AC
  Administered 2020-07-16: 25 mg via ORAL

## 2020-07-16 MED ORDER — ACETAMINOPHEN 325 MG PO TABS
ORAL_TABLET | ORAL | Status: AC
  Administered 2020-07-16: 650 mg via ORAL
  Filled 2020-07-16: qty 2

## 2020-07-16 MED ORDER — SODIUM CHLORIDE 0.9% IV SOLUTION: Freq: Once | INTRAVENOUS | Status: AC

## 2020-07-16 MED ORDER — ACETAMINOPHEN 325 MG PO TABS: 650.0000 mg | ORAL_TABLET | Freq: Once | ORAL | Status: AC

## 2020-07-16 MED ORDER — DIPHENHYDRAMINE HCL 25 MG PO CAPS
ORAL_CAPSULE | ORAL | Status: AC
  Filled 2020-07-16: qty 1
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
## 2020-07-16 NOTE — Anesthesia Preprocedure Evaluation (Addendum)
Anesthesia Evaluation  Patient identified by MRN, date of birth, ID band Patient awake    Reviewed: Allergy & Precautions, NPO status , Patient's Chart, lab work & pertinent test results  Airway Mallampati: I       Dental no notable dental hx. (+) Teeth Intact   Pulmonary Current Smoker,    Pulmonary exam normal breath sounds clear to auscultation       Cardiovascular Normal cardiovascular exam Rhythm:Regular Rate:Normal     Neuro/Psych PSYCHIATRIC DISORDERS Anxiety Depression    GI/Hepatic GERD  Medicated and Controlled,  Endo/Other    Renal/GU      Musculoskeletal  (+) Arthritis , Osteoarthritis,    Abdominal Normal abdominal exam  (+)   Peds  Hematology  (+) Blood dyscrasia, anemia ,   Anesthesia Other Findings   Reproductive/Obstetrics                            Anesthesia Physical Anesthesia Plan  ASA: II  Anesthesia Plan: Spinal   Post-op Pain Management:  Regional for Post-op pain   Induction:   PONV Risk Score and Plan: 0 and Ondansetron, Dexamethasone and Midazolam  Airway Management Planned:   Additional Equipment: None  Intra-op Plan:   Post-operative Plan:   Informed Consent: I have reviewed the patients History and Physical, chart, labs and discussed the procedure including the risks, benefits and alternatives for the proposed anesthesia with the patient or authorized representative who has indicated his/her understanding and acceptance.       Plan Discussed with: CRNA  Anesthesia Plan Comments: (See PAT note 07/15/2020, Konrad Felix, PA-C)       Anesthesia Quick Evaluation

## 2020-07-17 LAB — TYPE AND SCREEN
ABO/RH(D): O POS
Antibody Screen: NEGATIVE
Unit division: 0
Unit division: 0

## 2020-07-17 LAB — BPAM RBC
Blood Product Expiration Date: 202108242359
Blood Product Expiration Date: 202109152359
ISSUE DATE / TIME: 202108180934
ISSUE DATE / TIME: 202108181214
Unit Type and Rh: 5100
Unit Type and Rh: 5100

## 2020-07-18 ENCOUNTER — Other Ambulatory Visit (HOSPITAL_COMMUNITY): Payer: Medicare Other

## 2020-07-18 NOTE — Progress Notes (Signed)
Due to transportation issue, pt requires a RAPID COVID. Pt advised to arrive to hospital at 7:00 AM on 07-22-20, which is 3 hours prior to the scheduled procedure. Pt verbalized understanding.

## 2020-07-22 ENCOUNTER — Other Ambulatory Visit: Payer: Self-pay

## 2020-07-22 ENCOUNTER — Observation Stay (HOSPITAL_COMMUNITY)
Admission: RE | Admit: 2020-07-22 | Discharge: 2020-07-23 | Disposition: A | Discharge status: Home / Self Care | Payer: Medicare Other | Attending: Orthopedic Surgery | Admitting: Orthopedic Surgery

## 2020-07-22 ENCOUNTER — Ambulatory Visit (HOSPITAL_COMMUNITY): Payer: Medicare Other | Admitting: Certified Registered Nurse Anesthetist

## 2020-07-22 ENCOUNTER — Encounter (HOSPITAL_COMMUNITY): Payer: Self-pay | Admitting: Orthopedic Surgery

## 2020-07-22 ENCOUNTER — Observation Stay (HOSPITAL_COMMUNITY): Payer: Medicare Other

## 2020-07-22 ENCOUNTER — Encounter (HOSPITAL_COMMUNITY)
Admission: RE | Disposition: A | Discharge status: Home / Self Care | Payer: Self-pay | Source: Home / Self Care | Attending: Orthopedic Surgery

## 2020-07-22 ENCOUNTER — Ambulatory Visit (HOSPITAL_COMMUNITY): Payer: Medicare Other | Admitting: Physician Assistant

## 2020-07-22 DIAGNOSIS — M1711 Unilateral primary osteoarthritis, right knee: Secondary | ICD-10-CM | POA: Diagnosis not present

## 2020-07-22 DIAGNOSIS — Z20822 Contact with and (suspected) exposure to covid-19: Secondary | ICD-10-CM | POA: Insufficient documentation

## 2020-07-22 DIAGNOSIS — Z8546 Personal history of malignant neoplasm of prostate: Secondary | ICD-10-CM | POA: Insufficient documentation

## 2020-07-22 DIAGNOSIS — Z96651 Presence of right artificial knee joint: Secondary | ICD-10-CM

## 2020-07-22 DIAGNOSIS — Z79899 Other long term (current) drug therapy: Secondary | ICD-10-CM | POA: Diagnosis not present

## 2020-07-22 DIAGNOSIS — F17210 Nicotine dependence, cigarettes, uncomplicated: Secondary | ICD-10-CM | POA: Insufficient documentation

## 2020-07-22 DIAGNOSIS — Z7982 Long term (current) use of aspirin: Secondary | ICD-10-CM | POA: Diagnosis not present

## 2020-07-22 HISTORY — PX: TOTAL KNEE ARTHROPLASTY: SHX125

## 2020-07-22 LAB — CBC
HCT: 38.2 % — ABNORMAL LOW (ref 39.0–52.0)
Hemoglobin: 11 g/dL — ABNORMAL LOW (ref 13.0–17.0)
MCH: 22.7 pg — ABNORMAL LOW (ref 26.0–34.0)
MCHC: 28.8 g/dL — ABNORMAL LOW (ref 30.0–36.0)
MCV: 78.9 fL — ABNORMAL LOW (ref 80.0–100.0)
Platelets: 363 10*3/uL (ref 150–400)
RBC: 4.84 MIL/uL (ref 4.22–5.81)
RDW: 21.5 % — ABNORMAL HIGH (ref 11.5–15.5)
WBC: 6.4 10*3/uL (ref 4.0–10.5)
nRBC: 0 % (ref 0.0–0.2)

## 2020-07-22 LAB — SARS CORONAVIRUS 2 BY RT PCR (HOSPITAL ORDER, PERFORMED IN ~~LOC~~ HOSPITAL LAB): SARS Coronavirus 2: NEGATIVE

## 2020-07-22 LAB — BASIC METABOLIC PANEL
Anion gap: 9 (ref 5–15)
BUN: 8 mg/dL (ref 8–23)
CO2: 21 mmol/L — ABNORMAL LOW (ref 22–32)
Calcium: 8.4 mg/dL — ABNORMAL LOW (ref 8.9–10.3)
Chloride: 104 mmol/L (ref 98–111)
Creatinine, Ser: 0.88 mg/dL (ref 0.61–1.24)
GFR calc Af Amer: >60 mL/min (ref >60)
GFR calc non Af Amer: >60 mL/min (ref >60)
Glucose, Bld: 105 mg/dL — ABNORMAL HIGH (ref 70–99)
Potassium: 4.2 mmol/L (ref 3.5–5.1)
Sodium: 134 mmol/L — ABNORMAL LOW (ref 135–145)

## 2020-07-22 SURGERY — ARTHROPLASTY, KNEE, TOTAL
Anesthesia: Spinal | Site: Knee | Laterality: Right

## 2020-07-22 MED ORDER — CEFAZOLIN SODIUM-DEXTROSE 2-4 GM/100ML-% IV SOLN
2.0000 g | INTRAVENOUS | Status: AC
  Administered 2020-07-22: 2 g via INTRAVENOUS
  Filled 2020-07-22: qty 100

## 2020-07-22 MED ORDER — METOCLOPRAMIDE HCL 5 MG PO TABS: 5.0000 mg | ORAL_TABLET | Freq: Three times a day (TID) | ORAL | Status: DC | PRN

## 2020-07-22 MED ORDER — TRAZODONE HCL 50 MG PO TABS: 150.0000 mg | ORAL_TABLET | Freq: Every evening | ORAL | Status: DC | PRN

## 2020-07-22 MED ORDER — DEXAMETHASONE SODIUM PHOSPHATE 10 MG/ML IJ SOLN
INTRAMUSCULAR | Status: DC | PRN
  Administered 2020-07-22: 10 mg via INTRAVENOUS

## 2020-07-22 MED ORDER — HYDROCODONE-ACETAMINOPHEN 5-325 MG PO TABS
1.0000 | ORAL_TABLET | Freq: Four times a day (QID) | ORAL | 0 refills | Status: AC | PRN
Start: 2020-07-22 — End: 2020-07-27

## 2020-07-22 MED ORDER — 0.9 % SODIUM CHLORIDE (POUR BTL) OPTIME
TOPICAL | Status: DC | PRN
  Administered 2020-07-22: 1000 mL

## 2020-07-22 MED ORDER — BUPIVACAINE IN DEXTROSE 0.75-8.25 % IT SOLN
INTRATHECAL | Status: DC | PRN
  Administered 2020-07-22: 1.6 mL via INTRATHECAL

## 2020-07-22 MED ORDER — ORAL CARE MOUTH RINSE: 15.0000 mL | Freq: Once | OROMUCOSAL | Status: AC

## 2020-07-22 MED ORDER — SODIUM CHLORIDE (PF) 0.9 % IJ SOLN
INTRAMUSCULAR | Status: AC
  Filled 2020-07-22: qty 50

## 2020-07-22 MED ORDER — SODIUM CHLORIDE 0.9 % IR SOLN
Status: DC | PRN
  Administered 2020-07-22: 1000 mL

## 2020-07-22 MED ORDER — BUPIVACAINE LIPOSOME 1.3 % IJ SUSP
INTRAMUSCULAR | Status: DC | PRN
  Administered 2020-07-22: 20 mL

## 2020-07-22 MED ORDER — PROPOFOL 500 MG/50ML IV EMUL
INTRAVENOUS | Status: DC | PRN
  Administered 2020-07-22: 125 ug/kg/min via INTRAVENOUS

## 2020-07-22 MED ORDER — DOCUSATE SODIUM 100 MG PO CAPS
100.0000 mg | ORAL_CAPSULE | Freq: Two times a day (BID) | ORAL | Status: DC
  Administered 2020-07-22 – 2020-07-23 (×2): 100 mg via ORAL
  Filled 2020-07-22 (×2): qty 1

## 2020-07-22 MED ORDER — CITALOPRAM HYDROBROMIDE 40 MG PO TABS
40.0000 mg | ORAL_TABLET | Freq: Every morning | ORAL | Status: DC
  Administered 2020-07-22 – 2020-07-23 (×2): 40 mg via ORAL
  Filled 2020-07-22 (×2): qty 1
  Filled 2020-07-22 (×2): qty 2

## 2020-07-22 MED ORDER — LACTATED RINGERS IV SOLN
INTRAVENOUS | Status: DC
  Administered 2020-07-22: 1000 mL via INTRAVENOUS

## 2020-07-22 MED ORDER — STERILE WATER FOR IRRIGATION IR SOLN
Status: DC | PRN
  Administered 2020-07-22 (×2): 1000 mL

## 2020-07-22 MED ORDER — OXYCODONE HCL 5 MG PO TABS: 5.0000 mg | ORAL_TABLET | Freq: Once | ORAL | Status: DC | PRN

## 2020-07-22 MED ORDER — PROMETHAZINE HCL 25 MG/ML IJ SOLN: 6.2500 mg | INTRAMUSCULAR | Status: DC | PRN

## 2020-07-22 MED ORDER — METHOCARBAMOL 500 MG IVPB - SIMPLE MED
500.0000 mg | Freq: Four times a day (QID) | INTRAVENOUS | Status: DC | PRN
  Filled 2020-07-22: qty 50

## 2020-07-22 MED ORDER — POLYETHYLENE GLYCOL 3350 17 G PO PACK: 17.0000 g | PACK | Freq: Every day | ORAL | Status: DC | PRN

## 2020-07-22 MED ORDER — LACTATED RINGERS IV SOLN: INTRAVENOUS | Status: DC

## 2020-07-22 MED ORDER — BUPIVACAINE LIPOSOME 1.3 % IJ SUSP
20.0000 mL | Freq: Once | INTRAMUSCULAR | Status: DC
  Filled 2020-07-22: qty 20

## 2020-07-22 MED ORDER — ASPIRIN EC 81 MG PO TBEC: 81.0000 mg | DELAYED_RELEASE_TABLET | Freq: Every day | ORAL | 0 refills | Status: AC

## 2020-07-22 MED ORDER — ASPIRIN 81 MG PO CHEW
81.0000 mg | CHEWABLE_TABLET | Freq: Two times a day (BID) | ORAL | Status: DC
  Administered 2020-07-22 – 2020-07-23 (×2): 81 mg via ORAL
  Filled 2020-07-22 (×2): qty 1

## 2020-07-22 MED ORDER — ACETAMINOPHEN 160 MG/5ML PO SOLN: 325.0000 mg | ORAL | Status: DC | PRN

## 2020-07-22 MED ORDER — PHENYLEPHRINE HCL-NACL 10-0.9 MG/250ML-% IV SOLN
INTRAVENOUS | Status: DC | PRN
  Administered 2020-07-22: 25 ug/min via INTRAVENOUS

## 2020-07-22 MED ORDER — BUSPIRONE HCL 10 MG PO TABS
20.0000 mg | ORAL_TABLET | Freq: Two times a day (BID) | ORAL | Status: DC
  Administered 2020-07-22 – 2020-07-23 (×2): 20 mg via ORAL
  Filled 2020-07-22: qty 2
  Filled 2020-07-22: qty 4
  Filled 2020-07-22: qty 2

## 2020-07-22 MED ORDER — PROPOFOL 1000 MG/100ML IV EMUL
INTRAVENOUS | Status: AC
  Filled 2020-07-22: qty 100

## 2020-07-22 MED ORDER — FENTANYL CITRATE (PF) 100 MCG/2ML IJ SOLN: 25.0000 ug | INTRAMUSCULAR | Status: DC | PRN

## 2020-07-22 MED ORDER — METHOCARBAMOL 500 MG PO TABS
750.0000 mg | ORAL_TABLET | Freq: Two times a day (BID) | ORAL | Status: DC
  Administered 2020-07-22 – 2020-07-23 (×2): 750 mg via ORAL
  Filled 2020-07-22 (×2): qty 2

## 2020-07-22 MED ORDER — CEFAZOLIN SODIUM-DEXTROSE 2-4 GM/100ML-% IV SOLN
2.0000 g | Freq: Four times a day (QID) | INTRAVENOUS | Status: AC
  Administered 2020-07-22 (×2): 2 g via INTRAVENOUS
  Filled 2020-07-22 (×2): qty 100

## 2020-07-22 MED ORDER — MENTHOL 3 MG MT LOZG: 1.0000 | LOZENGE | OROMUCOSAL | Status: DC | PRN

## 2020-07-22 MED ORDER — ASPIRIN EC 81 MG PO TBEC: 81.0000 mg | DELAYED_RELEASE_TABLET | Freq: Two times a day (BID) | ORAL | Status: DC

## 2020-07-22 MED ORDER — CELECOXIB 200 MG PO CAPS
400.0000 mg | ORAL_CAPSULE | Freq: Once | ORAL | Status: AC
  Administered 2020-07-22: 400 mg via ORAL
  Filled 2020-07-22: qty 2

## 2020-07-22 MED ORDER — ACETAMINOPHEN 10 MG/ML IV SOLN: 1000.0000 mg | Freq: Once | INTRAVENOUS | Status: DC | PRN

## 2020-07-22 MED ORDER — MORPHINE SULFATE (PF) 2 MG/ML IV SOLN: 0.5000 mg | INTRAVENOUS | Status: DC | PRN

## 2020-07-22 MED ORDER — MIDAZOLAM HCL 2 MG/2ML IJ SOLN
1.0000 mg | INTRAMUSCULAR | Status: DC
  Administered 2020-07-22: 2 mg via INTRAVENOUS
  Filled 2020-07-22: qty 2

## 2020-07-22 MED ORDER — OXYCODONE HCL 5 MG/5ML PO SOLN: 5.0000 mg | Freq: Once | ORAL | Status: DC | PRN

## 2020-07-22 MED ORDER — PHENOL 1.4 % MT LIQD: 1.0000 | OROMUCOSAL | Status: DC | PRN

## 2020-07-22 MED ORDER — MIRTAZAPINE 15 MG PO TABS
15.0000 mg | ORAL_TABLET | Freq: Every day | ORAL | Status: DC
  Administered 2020-07-22: 15 mg via ORAL
  Filled 2020-07-22: qty 1

## 2020-07-22 MED ORDER — ONDANSETRON HCL 4 MG PO TABS: 4.0000 mg | ORAL_TABLET | Freq: Four times a day (QID) | ORAL | Status: DC | PRN

## 2020-07-22 MED ORDER — FENTANYL CITRATE (PF) 100 MCG/2ML IJ SOLN
50.0000 ug | Freq: Once | INTRAMUSCULAR | Status: AC
  Administered 2020-07-22: 100 ug via INTRAVENOUS
  Filled 2020-07-22: qty 2

## 2020-07-22 MED ORDER — BISACODYL 10 MG RE SUPP: 10.0000 mg | Freq: Every day | RECTAL | Status: DC | PRN

## 2020-07-22 MED ORDER — ROPIVACAINE HCL 7.5 MG/ML IJ SOLN
INTRAMUSCULAR | Status: DC | PRN
  Administered 2020-07-22 (×4): 5 mL via PERINEURAL

## 2020-07-22 MED ORDER — PROPOFOL 500 MG/50ML IV EMUL
INTRAVENOUS | Status: AC
  Filled 2020-07-22: qty 50

## 2020-07-22 MED ORDER — METHOCARBAMOL 500 MG PO TABS
500.0000 mg | ORAL_TABLET | Freq: Four times a day (QID) | ORAL | Status: DC | PRN
  Administered 2020-07-22: 500 mg via ORAL
  Filled 2020-07-22: qty 1

## 2020-07-22 MED ORDER — TRANEXAMIC ACID-NACL 1000-0.7 MG/100ML-% IV SOLN
1000.0000 mg | INTRAVENOUS | Status: AC
  Administered 2020-07-22: 1000 mg via INTRAVENOUS
  Filled 2020-07-22: qty 100

## 2020-07-22 MED ORDER — PANTOPRAZOLE SODIUM 40 MG PO TBEC
40.0000 mg | DELAYED_RELEASE_TABLET | Freq: Every day | ORAL | Status: DC
  Administered 2020-07-22 – 2020-07-23 (×2): 40 mg via ORAL
  Filled 2020-07-22 (×2): qty 1

## 2020-07-22 MED ORDER — HYDROCODONE-ACETAMINOPHEN 5-325 MG PO TABS
1.0000 | ORAL_TABLET | ORAL | Status: DC | PRN
  Filled 2020-07-22: qty 2

## 2020-07-22 MED ORDER — ACETAMINOPHEN 325 MG PO TABS: 325.0000 mg | ORAL_TABLET | ORAL | Status: DC | PRN

## 2020-07-22 MED ORDER — CHLORHEXIDINE GLUCONATE 0.12 % MT SOLN
15.0000 mL | Freq: Once | OROMUCOSAL | Status: AC
  Administered 2020-07-22: 15 mL via OROMUCOSAL

## 2020-07-22 MED ORDER — PROPOFOL 10 MG/ML IV BOLUS
INTRAVENOUS | Status: DC | PRN
  Administered 2020-07-22: 40 mg via INTRAVENOUS

## 2020-07-22 MED ORDER — ACETAMINOPHEN 325 MG PO TABS: 325.0000 mg | ORAL_TABLET | Freq: Four times a day (QID) | ORAL | Status: DC | PRN

## 2020-07-22 MED ORDER — ONDANSETRON HCL 4 MG/2ML IJ SOLN: 4.0000 mg | Freq: Four times a day (QID) | INTRAMUSCULAR | Status: DC | PRN

## 2020-07-22 MED ORDER — ACETAMINOPHEN 500 MG PO TABS
500.0000 mg | ORAL_TABLET | Freq: Four times a day (QID) | ORAL | Status: AC
  Administered 2020-07-22 – 2020-07-23 (×2): 500 mg via ORAL
  Filled 2020-07-22 (×3): qty 1

## 2020-07-22 MED ORDER — METOCLOPRAMIDE HCL 5 MG/ML IJ SOLN: 5.0000 mg | Freq: Three times a day (TID) | INTRAMUSCULAR | Status: DC | PRN

## 2020-07-22 MED ORDER — SODIUM CHLORIDE 0.9% FLUSH
INTRAVENOUS | Status: DC | PRN
  Administered 2020-07-22: 30 mL

## 2020-07-22 MED ORDER — HYDROCODONE-ACETAMINOPHEN 5-325 MG PO TABS: 1.0000 | ORAL_TABLET | Freq: Four times a day (QID) | ORAL | Status: DC | PRN

## 2020-07-22 MED ORDER — MEPERIDINE HCL 50 MG/ML IJ SOLN: 6.2500 mg | INTRAMUSCULAR | Status: DC | PRN

## 2020-07-22 MED ORDER — CLONIDINE HCL (ANALGESIA) 100 MCG/ML EP SOLN
EPIDURAL | Status: DC | PRN
  Administered 2020-07-22: 100 ug

## 2020-07-22 MED ORDER — METHOCARBAMOL 750 MG PO TABS: 750.0000 mg | ORAL_TABLET | Freq: Three times a day (TID) | ORAL | 0 refills | Status: AC | PRN

## 2020-07-22 MED ORDER — ONDANSETRON HCL 4 MG/2ML IJ SOLN
INTRAMUSCULAR | Status: DC | PRN
  Administered 2020-07-22: 4 mg via INTRAVENOUS

## 2020-07-22 MED ORDER — HYDROCODONE-ACETAMINOPHEN 7.5-325 MG PO TABS
1.0000 | ORAL_TABLET | ORAL | Status: DC | PRN
  Administered 2020-07-22 (×2): 1 via ORAL
  Administered 2020-07-23 (×2): 2 via ORAL
  Filled 2020-07-22: qty 1
  Filled 2020-07-22: qty 2
  Filled 2020-07-22: qty 1
  Filled 2020-07-22: qty 2

## 2020-07-22 MED ORDER — ACETAMINOPHEN 500 MG PO TABS
1000.0000 mg | ORAL_TABLET | Freq: Once | ORAL | Status: AC
  Administered 2020-07-22: 1000 mg via ORAL
  Filled 2020-07-22: qty 2

## 2020-07-22 MED ORDER — ROPIVACAINE HCL 5 MG/ML IJ SOLN
INTRAMUSCULAR | Status: DC | PRN
  Administered 2020-07-22 (×2): 5 mL via PERINEURAL

## 2020-07-22 MED ORDER — ONDANSETRON HCL 4 MG PO TABS: 4.0000 mg | ORAL_TABLET | Freq: Every day | ORAL | 1 refills | Status: AC | PRN

## 2020-07-22 MED ORDER — CELECOXIB 200 MG PO CAPS
200.0000 mg | ORAL_CAPSULE | Freq: Two times a day (BID) | ORAL | Status: DC
  Administered 2020-07-22 – 2020-07-23 (×2): 200 mg via ORAL
  Filled 2020-07-22 (×2): qty 1

## 2020-07-22 MED ORDER — CELECOXIB 200 MG PO CAPS: 200.0000 mg | ORAL_CAPSULE | Freq: Two times a day (BID) | ORAL | 0 refills | Status: AC

## 2020-07-22 SURGICAL SUPPLY — 51 items
BLADE HEX COATED 2.75 (ELECTRODE) ×3 IMPLANT
BLADE SAG 18X100X1.27 (BLADE) ×3 IMPLANT
BLADE SAGITTAL 25.0X1.37X90 (BLADE) ×2 IMPLANT
BLADE SAGITTAL 25.0X1.37X90MM (BLADE) ×1
BLADE SURG 15 STRL LF DISP TIS (BLADE) ×1 IMPLANT
BLADE SURG 15 STRL SS (BLADE) ×2
BLADE SURG SZ10 CARB STEEL (BLADE) ×6 IMPLANT
BNDG ELASTIC 6X10 VLCR STRL LF (GAUZE/BANDAGES/DRESSINGS) ×3 IMPLANT
BOWL SMART MIX CTS (DISPOSABLE) IMPLANT
CLOSURE STERI-STRIP 1/2X4 (GAUZE/BANDAGES/DRESSINGS) ×1
CLSR STERI-STRIP ANTIMIC 1/2X4 (GAUZE/BANDAGES/DRESSINGS) ×2 IMPLANT
COVER SURGICAL LIGHT HANDLE (MISCELLANEOUS) ×3 IMPLANT
COVER WAND RF STERILE (DRAPES) IMPLANT
CUFF TOURN SGL QUICK 34 (TOURNIQUET CUFF) ×2
CUFF TRNQT CYL 34X4.125X (TOURNIQUET CUFF) ×1 IMPLANT
DECANTER SPIKE VIAL GLASS SM (MISCELLANEOUS) ×3 IMPLANT
DRAPE U-SHAPE 47X51 STRL (DRAPES) ×3 IMPLANT
DRSG MEPILEX BORDER 4X12 (GAUZE/BANDAGES/DRESSINGS) ×3 IMPLANT
DURAPREP 26ML APPLICATOR (WOUND CARE) ×6 IMPLANT
FEMORAL POST STABILIZED SZ7 RT (Joint) ×3 IMPLANT
GLOVE BIO SURGEON STRL SZ7.5 (GLOVE) ×6 IMPLANT
GLOVE BIOGEL PI IND STRL 7.5 (GLOVE) ×1 IMPLANT
GLOVE BIOGEL PI IND STRL 8 (GLOVE) ×1 IMPLANT
GLOVE BIOGEL PI INDICATOR 7.5 (GLOVE) ×2
GLOVE BIOGEL PI INDICATOR 8 (GLOVE) ×2
GOWN STRL REUS W/ TWL LRG LVL3 (GOWN DISPOSABLE) ×2 IMPLANT
GOWN STRL REUS W/TWL LRG LVL3 (GOWN DISPOSABLE) ×4
HANDPIECE INTERPULSE COAX TIP (DISPOSABLE) ×2
HOLDER FOLEY CATH W/STRAP (MISCELLANEOUS) IMPLANT
IMMOBILIZER KNEE 22 UNIV (SOFTGOODS) ×3 IMPLANT
INSERT TIBIAL BEARING KNEE (Joint) ×3 IMPLANT
KIT TURNOVER KIT A (KITS) IMPLANT
KNEE PATELLA ASYMMETRIC 10X35 (Knees) ×3 IMPLANT
KNEE TIBIAL COMPONENT SZ7 (Knees) ×3 IMPLANT
MANIFOLD NEPTUNE II (INSTRUMENTS) ×3 IMPLANT
NS IRRIG 1000ML POUR BTL (IV SOLUTION) ×3 IMPLANT
PACK ICE MAXI GEL EZY WRAP (MISCELLANEOUS) ×3 IMPLANT
PACK TOTAL KNEE CUSTOM (KITS) ×3 IMPLANT
PENCIL SMOKE EVACUATOR (MISCELLANEOUS) IMPLANT
PIN FLUTED HEDLESS FIX 3.5X1/8 (PIN) ×3 IMPLANT
PROTECTOR NERVE ULNAR (MISCELLANEOUS) ×3 IMPLANT
SET HNDPC FAN SPRY TIP SCT (DISPOSABLE) ×1 IMPLANT
SUT MNCRL AB 4-0 PS2 18 (SUTURE) ×3 IMPLANT
SUT VIC AB 0 CT1 36 (SUTURE) ×3 IMPLANT
SUT VIC AB 1 CT1 36 (SUTURE) ×6 IMPLANT
SUT VIC AB 2-0 CT1 27 (SUTURE) ×2
SUT VIC AB 2-0 CT1 TAPERPNT 27 (SUTURE) ×1 IMPLANT
TRAY FOLEY MTR SLVR 14FR STAT (SET/KITS/TRAYS/PACK) IMPLANT
TRAY FOLEY MTR SLVR 16FR STAT (SET/KITS/TRAYS/PACK) IMPLANT
WRAP KNEE MAXI GEL POST OP (GAUZE/BANDAGES/DRESSINGS) ×3 IMPLANT
YANKAUER SUCT BULB TIP 10FT TU (MISCELLANEOUS) ×3 IMPLANT

## 2020-07-22 NOTE — Anesthesia Postprocedure Evaluation (Signed)
Anesthesia Post Note  Patient: Michael Rosales  Procedure(s) Performed: TOTAL KNEE ARTHROPLASTY (Right Knee)     Patient location during evaluation: PACU Anesthesia Type: Spinal Level of consciousness: sedated Pain management: pain level controlled Vital Signs Assessment: post-procedure vital signs reviewed and stable Respiratory status: spontaneous breathing Cardiovascular status: stable Postop Assessment: no headache, no backache, spinal receding, patient able to bend at knees and no apparent nausea or vomiting Anesthetic complications: no   No complications documented.  Last Vitals:  Vitals:   07/22/20 1300 07/22/20 1315  BP: (!) 94/59 108/85  Pulse: 65 67  Resp: (!) 9 10  Temp:    SpO2: 95% 97%    Last Pain:  Vitals:   07/22/20 1315  TempSrc:   PainSc: [place] Jr

## 2020-07-22 NOTE — Evaluation (Signed)
Physical Therapy Evaluation Patient Details Name: Michael Rosales MRN: [id number] DOB: [birth year] Today's Date: 07/22/2020   History of Present Illness  Patient is 69 y.o. male s/p Rt TKA on 07/22/20 with PMH significant for OA, prostate cancer, depression, anxiety.    Clinical Impression  Michael Rosales is a 69 y.o. male POD 0 s/p Rt TKA. Patient reports independence with mobility at baseline. Patient is now limited by functional impairments (see PT problem list below) and requires min-mod assist for transfers and gait with RW. Patient was able to ambulate ~15 feet with RW and min assist. Patient instructed in exercise to facilitate ROM and circulation. Patient will benefit from continued skilled PT interventions to address impairments and progress towards PLOF. Acute PT will follow to progress mobility and stair training in preparation for safe discharge home.     Follow Up Recommendations Follow surgeons recommendation for DC plan and follow-up therapies    Equipment Recommendations  None recommended by PT    Recommendations for Other Services       Precautions / Restrictions Precautions Precautions: Fall Restrictions Weight Bearing Restrictions: No Other Position/Activity Restrictions: WBAT      Mobility  Bed Mobility Overal bed mobility: Needs Assistance Bed Mobility: Supine to Sit     Supine to sit: Min assist;HOB elevated     General bed mobility comments: cues to use bed rail, light assist to raise trunk fully and bring LE's off EOB.  Transfers Overall transfer level: Needs assistance Equipment used: Rolling walker (2 wheeled) Transfers: Sit to/from Stand Sit to Stand: Mod assist;From elevated surface;Min assist         General transfer comment: cues for safe technique with RW. pt required mod assist for power up and to steady with rising.  Ambulation/Gait Ambulation/Gait assistance: Min assist Gait Distance (Feet): 15 Feet Assistive device: Rolling  walker (2 wheeled) Gait Pattern/deviations: Step-to pattern;Decreased stride length;Decreased weight shift to right;Decreased stance time - right Gait velocity: decr   General Gait Details: Cues for safe step pattern and proximity to RW, min assist to steady throughout and manage walker position.  Stairs            Wheelchair Mobility    Modified Rankin (Stroke Patients Only)       Balance Overall balance assessment: Needs assistance Sitting-balance support: Feet supported Sitting balance-Leahy Scale: Fair     Standing balance support: During functional activity;Bilateral upper extremity supported Standing balance-Leahy Scale: Poor              Pertinent Vitals/Pain Pain Assessment: 0-10 Pain Score: 8  Pain Location: Rt knee Pain Descriptors / Indicators: Aching;Discomfort Pain Intervention(s): Limited activity within patient's tolerance;Monitored during session;Repositioned;Ice applied    Home Living Family/patient expects to be discharged to:: Other (Comment) [employer]) Living Arrangements: Non-relatives/Friends Available Help at Discharge: Friend(s);Neighbor Type of Home: House Home Access: Stairs to enter Entrance Stairs-Rails: None Entrance Stairs-Number of Steps: 4 Home Layout: One level Home Equipment: Grab bars - tub/shower;Walker - 2 wheels;Cane - single point      Prior Function Level of Independence: Independent               Hand Dominance   Dominant Hand: Right    Extremity/Trunk Assessment   Upper Extremity Assessment Upper Extremity Assessment: Overall WFL for tasks assessed    Lower Extremity Assessment Lower Extremity Assessment: Overall WFL for tasks assessed;RLE deficits/detail RLE Deficits / Details: no extensor lag with SLR RLE Sensation: WNL RLE Coordination: WNL  Cervical / Trunk Assessment Cervical / Trunk Assessment: Normal  Communication   Communication: No difficulties  Cognition Arousal/Alertness:  Awake/alert Behavior During Therapy: WFL for tasks assessed/performed Overall Cognitive Status: Within Functional Limits for tasks assessed           General Comments      Exercises Total Joint Exercises Ankle Circles/Pumps: AROM;Both;20 reps;Seated Quad Sets: AROM;Right;5 reps;Seated Heel Slides: AROM;Right;5 reps;Seated   Assessment/Plan    PT Assessment Patient needs continued PT services  PT Problem List Decreased strength;Decreased range of motion;Decreased activity tolerance;Decreased balance;Decreased mobility;Decreased knowledge of use of DME;Pain;Decreased knowledge of precautions       PT Treatment Interventions DME instruction;Gait training;Stair training;Functional mobility training;Therapeutic activities;Therapeutic exercise;Balance training;Patient/family education    PT Goals (Current goals can be found in the Care Plan section)  Acute Rehab PT Goals Patient Stated Goal: get back to home and independent PT Goal Formulation: With patient Time For Goal Achievement: 07/29/20 Potential to Achieve Goals: Good    Frequency 7X/week   Barriers to discharge           AM-PAC PT "6 Clicks" Mobility  Outcome Measure Help needed turning from your back to your side while in a flat bed without using bedrails?: A Little Help needed moving from lying on your back to sitting on the side of a flat bed without using bedrails?: A Little Help needed moving to and from a bed to a chair (including a wheelchair)?: A Little Help needed standing up from a chair using your arms (e.g., wheelchair or bedside chair)?: A Lot Help needed to walk in hospital room?: A Little Help needed climbing 3-5 steps with a railing? : A Lot 6 Click Score: 16    End of Session Equipment Utilized During Treatment: Gait belt Activity Tolerance: Patient tolerated treatment well Patient left: in chair;with call bell/phone within reach;with chair alarm set Nurse Communication: Mobility status PT  Visit Diagnosis: Muscle weakness (generalized) (M62.81);Difficulty in walking, not elsewhere classified (R26.2)    Time: 7414-2395 PT Time Calculation (min) (ACUTE ONLY): 25 min   Charges:   PT Evaluation $PT Eval Low Complexity: 1 Low PT Treatments $Gait Training: 8-22 mins        Verner Mould, DPT Acute Rehabilitation Services  Office [phone] Pager [phone]  07/22/2020 4:54 PM

## 2020-07-22 NOTE — Op Note (Signed)
DATE OF SURGERY:  07/22/2020 TIME: 11:45 AM  PATIENT NAME:  Michael Rosales   AGE: 69 y.o.    PRE-OPERATIVE DIAGNOSIS:  OA RIGHT KNEE  POST-OPERATIVE DIAGNOSIS:  Same  PROCEDURE:  Procedure(s): TOTAL KNEE ARTHROPLASTY   SURGEON:  Renette Butters, MD   ASSISTANT:  Margy Clarks, PA-C, he was present and scrubbed throughout the case, critical for completion in a timely fashion, and for retraction, instrumentation, and closure.    OPERATIVE IMPLANTS: [employer]ker Triathlon Posterior Stabilized. Press fit knee  Femur size 7, Tibia size 7, Patella size 35 3-peg oval button, with a 9 mm polyethylene insert.   PREOPERATIVE INDICATIONS:  Michael Rosales is a 69 y.o. year old male with end stage bone on bone degenerative arthritis of the knee who failed conservative treatment, including injections, antiinflammatories, activity modification, and assistive devices, and had significant impairment of their activities of daily living, and elected for Total Knee Arthroplasty.   The risks, benefits, and alternatives were discussed at length including but not limited to the risks of infection, bleeding, nerve injury, stiffness, blood clots, the need for revision surgery, cardiopulmonary complications, among others, and they were willing to proceed.   OPERATIVE DESCRIPTION:  The patient was brought to the operative room and placed in a supine position.  General anesthesia was administered.  IV antibiotics were given.  The lower extremity was prepped and draped in the usual sterile fashion.  Time out was performed.  The leg was elevated and exsanguinated and the tourniquet was inflated.  Anterior approach was performed.  The patella was everted and osteophytes were removed.  The anterior horn of the medial and lateral meniscus was removed.   The distal femur was opened with the drill and the intramedullary distal femoral cutting jig was utilized, set at 5 degrees resecting 10 mm off the distal femur.   Care was taken to protect the collateral ligaments.  The distal femoral sizing jig was applied, taking care to avoid notching.  Then the 4-in-1 cutting jig was applied and the anterior and posterior femur was cut, along with the chamfer cuts.  All posterior osteophytes were removed.  The flexion gap was then measured and was symmetric with the extension gap.  Then the extramedullary tibial cutting jig was utilized making the appropriate cut using the anterior tibial crest as a reference building in appropriate posterior slope.  Care was taken during the cut to protect the medial and collateral ligaments.  The proximal tibia was removed along with the posterior horns of the menisci.  The PCL was sacrificed.    The extensor gap was measured and was approximately 67mm.    I completed the distal femoral preparation using the appropriate jig to prepare the box.  The patella was then measured, and cut with the saw.    The proximal tibia sized and prepared accordingly with the reamer and the punch, and then all components were trialed with the above sized poly insert.  The knee was found to have excellent balance and full motion.    The above named components were then impacted into place and Poly tibial piece and patella were inserted.  I was very happy with his stability and ROM  I performed a periarticular injection with marcaine and toradol  The knee was easily taken through a range of motion and the patella tracked well and the knee irrigated copiously and the parapatellar and subcutaneous tissue closed with vicryl, and monocryl with steri strips for the skin.  The incision was dressed with sterile gauze and the tourniquet released and the patient was awakened and returned to the PACU in stable and satisfactory condition.  There were no complications.  Total tourniquet time was roughly 70 minutes.   POSTOPERATIVE PLAN: post op Abx, DVT px: SCD's, TED's, Early ambulation and chemical  px

## 2020-07-22 NOTE — Anesthesia Procedure Notes (Signed)
Spinal  Patient location during procedure: OR Start time: 07/22/2020 10:29 AM End time: 07/22/2020 10:31 AM Staffing Performed: anesthesiologist  Anesthesiologist: Lyn Hollingshead, MD Preanesthetic Checklist Completed: patient identified, IV checked, site marked, risks and benefits discussed, surgical consent, monitors and equipment checked, pre-op evaluation and timeout performed Spinal Block Patient position: sitting Prep: DuraPrep and site prepped and draped Patient monitoring: continuous pulse ox and blood pressure Approach: midline Location: L3-4 Injection technique: single-shot Needle Needle type: Pencan  Needle gauge: 24 G Needle length: 10 cm Needle insertion depth: 6 cm Assessment Sensory level: T8

## 2020-07-22 NOTE — Transfer of Care (Signed)
Immediate Anesthesia Transfer of Care Note  Patient: Michael Rosales  Procedure(s) Performed: TOTAL KNEE ARTHROPLASTY (Right Knee)  Patient Location: PACU  Anesthesia Type:Spinal  Level of Consciousness: drowsy  Airway & Oxygen Therapy: Patient Spontanous Breathing and Patient connected to face mask oxygen  Post-op Assessment: Report given to RN and Post -op Vital signs reviewed and stable  Post vital signs: Reviewed and stable  Last Vitals:  Vitals Value Taken Time  BP 118/95 07/22/20 1253  Temp    Pulse 70 07/22/20 1254  Resp 8 07/22/20 1254  SpO2 89 % 07/22/20 1254  Vitals shown include unvalidated device data.  Last Pain:  Vitals:   07/22/20 1000  TempSrc:   PainSc: 0-No pain         Complications: No complications documented.

## 2020-07-22 NOTE — Anesthesia Procedure Notes (Signed)
Anesthesia Regional Block: Adductor canal block   Pre-Anesthetic Checklist: ,, timeout performed, Correct Patient, Correct Site, Correct Laterality, Correct Procedure, Correct Position, site marked, Risks and benefits discussed,  Surgical consent,  Pre-op evaluation,  At surgeon's request and post-op pain management  Laterality: Lower and Right  Prep: chloraprep       Needles:  Injection technique: Single-shot  Needle Type: Echogenic Stimulator Needle     Needle Length: 10cm  Needle Gauge: 21   Needle insertion depth: 2.5 cm   Additional Needles:   Procedures:,,,, ultrasound used (permanent image in chart),,,,  Narrative:  Start time: 07/22/2020 9:05 AM End time: 07/22/2020 9:15 AM Injection made incrementally with aspirations every 5 mL.  Performed by: Personally  Anesthesiologist: Lyn Hollingshead, MD

## 2020-07-22 NOTE — Interval H&P Note (Signed)
History and Physical Interval Note:  07/22/2020 7:01 AM  Michael Rosales  has presented today for surgery, with the diagnosis of OA RIGHT KNEE.  The various methods of treatment have been discussed with the patient and family. After consideration of risks, benefits and other options for treatment, the patient has consented to  Procedure(s): TOTAL KNEE ARTHROPLASTY (Right) as a surgical intervention.  The patient's history has been reviewed, patient examined, no change in status, stable for surgery.  I have reviewed the patient's chart and labs.  Questions were answered to the patient's satisfaction.     Renette Butters

## 2020-07-22 NOTE — Progress Notes (Addendum)
Assisted Dr. Hatchett with right, ultrasound guided, adductor canal block. Side rails up, monitors on throughout procedure. See vital signs in flow sheet. Tolerated Procedure well.  

## 2020-07-23 ENCOUNTER — Encounter (HOSPITAL_COMMUNITY): Payer: Self-pay | Admitting: Orthopedic Surgery

## 2020-07-23 DIAGNOSIS — M1711 Unilateral primary osteoarthritis, right knee: Secondary | ICD-10-CM | POA: Diagnosis not present

## 2020-07-23 NOTE — Discharge Summary (Signed)
Discharge Summary  Patient ID: Michael Rosales MRN: [id number] DOB/AGE: May 28, 1951 69 y.o.  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   Nocturia    OA (osteoarthritis)    knees   Prostate cancer ([place])     Surgeries: Procedure(s): TOTAL KNEE ARTHROPLASTY on 07/22/2020   Consultants (if any):   Discharged Condition: Improved  Hospital Course: Michael Rosales is an 69 y.o. male who was admitted 07/22/2020 with a diagnosis of <principal problem not specified> and went to the operating room on 07/22/2020 and underwent the above named procedures.     He was given perioperative antibiotics:  Anti-infectives (From admission, onward)   Start     Dose/Rate Route Frequency Ordered Stop   07/22/20 1630  ceFAZolin (ANCEF) IVPB 2g/100 mL premix        2 g 200 mL/hr over 30 Minutes Intravenous Every 6 hours 07/22/20 1427 07/22/20 2218   07/22/20 0730  ceFAZolin (ANCEF) IVPB 2g/100 mL premix        2 g 200 mL/hr over 30 Minutes Intravenous On call to O.R. 07/22/20 0962 07/22/20 1027    .  He was given sequential compression devices, early ambulation, and ASA  for DVT prophylaxis.  He benefited maximally from the hospital stay and there were no complications.    Recent vital signs:  Vitals:   07/23/20 0123 07/23/20 0533  BP: 101/62 139/88  Pulse: (!) 56 (!) 52  Resp: 16 18  Temp: 98.1 F (36.7 C) 97.6 F (36.4 C)  SpO2: 94% 98%    Recent laboratory studies:  Lab Results  Component Value Date   HGB 11.0 (L) 07/22/2020   HGB 9.3 (L) 07/15/2020   HGB 8.8 (L) 01/26/2019    Lab Results  Component Value Date   WBC 6.4 07/22/2020   PLT 363 07/22/2020   Lab Results  Component Value Date   INR 1.1 07/15/2020   Lab Results  Component Value Date   NA 134 (L) 07/22/2020   K 4.2 07/22/2020   CL 104 07/22/2020   CO2 21 (L) 07/22/2020   BUN 8 07/22/2020   CREATININE 0.88 07/22/2020   GLUCOSE 105 (H) 07/22/2020    Discharge Medications:   Allergies as of 07/23/2020   No Known Allergies     Medication List    STOP taking these medications   ibuprofen 800 MG tablet Commonly known as: ADVIL   naproxen sodium 220 MG tablet Commonly known as: ALEVE   traMADol 50 MG tablet Commonly known as: ULTRAM     TAKE these medications   aspirin EC 81 MG tablet Take 1 tablet (81 mg total) by mouth daily for 60 doses. Swallow whole.   busPIRone 10 MG tablet Commonly known as: BUSPAR Take 20 mg by mouth 2 (two) times daily.   celecoxib 200 MG capsule Commonly known as: CeleBREX Take 1 capsule (200 mg total) by mouth 2 (two) times daily for 14 days.   citalopram 40 MG tablet Commonly known as: CELEXA Take 40 mg by  mouth every morning.   esomeprazole 40 MG capsule Commonly known as: NEXIUM Take 40 mg by mouth daily at 12 noon.   HYDROcodone-acetaminophen 5-325 MG tablet Commonly known as: NORCO/VICODIN Take 1-2 tablets by mouth every 6 (six) hours as needed for up to 5 days for moderate pain.   methocarbamol 750 MG tablet Commonly known as: Robaxin-750 Take 1 tablet (750 mg total) by mouth every 8 (eight) hours as needed for up to 5 days for muscle spasms.   mirtazapine 15 MG tablet Commonly known as: REMERON Take 15 mg by mouth at bedtime.   omeprazole 20 MG capsule Commonly known as: PRILOSEC Take 20 mg by mouth daily as needed (acid reflux).   ondansetron 4 MG tablet Commonly known as: Zofran Take 1 tablet (4 mg total) by mouth daily as needed for up to 5 days for nausea or vomiting.   traZODone 150 MG tablet Commonly known as:  DESYREL Take 150 mg by mouth at bedtime as needed for sleep.       Diagnostic Studies: DG Knee 1-2 Views Right  Result Date: 07/22/2020 CLINICAL DATA:  Status post right knee arthroplasty. EXAM: RIGHT KNEE - 1-2 VIEW COMPARISON:  January 02, 2018. FINDINGS: The right femoral and tibial components appear to be well situated. Expected postoperative changes are noted in the soft tissues anteriorly. IMPRESSION: Status post right total knee arthroplasty. Electronically Signed   By: Marijo Conception M.D.   On: 07/22/2020 14:02    Disposition: Discharge disposition: 01-Home or Self Care          Follow-up Information    Renette Butters, MD In 2 weeks.   Specialty: Orthopedic Surgery Contact information: [address] [place] [address] [phone]                Signed: Rachael Fee PA-C 07/23/2020, 7:47 AM

## 2020-07-23 NOTE — Plan of Care (Signed)
Plan of care reviewed and discussed with the patient. 

## 2020-07-23 NOTE — Progress Notes (Signed)
    Subjective: Patient reports pain as mild.  Tolerating diet.  Urinating.  +Flatus.  No CP, SOB.   OOB. Voiding via foley  Objective:   VITALS:   Vitals:   07/22/20 1831 07/22/20 2134 07/23/20 0123 07/23/20 0533  BP: 114/77 101/70 101/62 139/88  Pulse: 67 (!) 56 (!) 56 (!) 52  Resp: 17 16 16 18   Temp: (!) 97.5 F (36.4 C) 98.7 F (37.1 C) 98.1 F (36.7 C) 97.6 F (36.4 C)  TempSrc: Oral Oral Oral Oral  SpO2: 97% 96% 94% 98%  Weight:      Height:       CBC Latest Ref Rng & Units 07/22/2020 07/15/2020 01/26/2019  WBC 4.0 - 10.5 K/uL 6.4 10.1 -  Hemoglobin 13.0 - 17.0 g/dL 11.0(L) 9.3(L) 8.8(L)  Hematocrit 39 - 52 % 38.2(L) 32.2(L) 26.0(L)  Platelets 150 - 400 K/uL 363 419(H) -   BMP Latest Ref Rng & Units 07/22/2020 07/15/2020 01/26/2019  Glucose 70 - 99 mg/dL 105(H) 107(H) 95  BUN 8 - 23 mg/dL 8 12 -  Creatinine 0.61 - 1.24 mg/dL 0.88 1.22 -  Sodium 135 - 145 mmol/L 134(L) 133(L) 138  Potassium 3.5 - 5.1 mmol/L 4.2 4.6 3.8  Chloride 98 - 111 mmol/L 104 103 -  CO2 22 - 32 mmol/L 21(L) 22 -  Calcium 8.9 - 10.3 mg/dL 8.4(L) 8.4(L) -   Intake/Output      08/24 0701 - 08/25 0700 08/25 0701 - 08/26 0700   P.O. 420    I.V. (mL/kg) 3427.3 (40.8)    IV Piggyback 100.1    Total Intake(mL/kg) 3947.3 (47)    Urine (mL/kg/hr) 1450 (0.7)    Blood 50    Total Output 1500    Net +2447.3            Physical Exam: General: NAD.  Resting comfortably in bed  Resp: No increased wob Cardio: regular rate and rhythm ABD soft Neurologically intact MSK Neurovascularly intact Sensation intact distally Intact pulses distally Dorsiflexion/Plantar flexion intact Incision: dressing C/D/I Neurovascular intact Intact pulses distally Dorsiflexion/Plantar flexion intact Compartment soft  Assessment: 1 Day Post-Op  S/P Procedure(s) (LRB): TOTAL KNEE ARTHROPLASTY (Right) by Dr. Ernesta Amble. Murphy on 07/23/2020  Active Problems:   Total knee replacement status, right   Primary  osteoarthritis, status post Right  knee arthroplasty   Plan: Up with therapy D/C IV fluids  D/C foley IV TKO Incentive Spirometry Elevate and Apply ice CPM, bone foam Plan for d/c to home today with OPPT  Weight Bearing: Weight Bearing as Tolerated (WBAT)  Dressings: Maintain Mepilex.  Please ensure thigh high TED hose are applied to operative leg prior to discharge. VTE prophylaxis: Aspirin, SCDs, ambulation Dispo: Home    - Unanticipated findings during/Post Surgery: None  - Patient is a high risk of re-admission due to: None    Rachael Fee, PA-C 07/23/2020, 7:42 AM

## 2020-07-23 NOTE — Progress Notes (Signed)
Physical Therapy Treatment Patient Details Name: Michael Rosales MRN: [id number] DOB: [birth year] Today's Date: 07/23/2020    History of Present Illness Patient is 69 y.o. male s/p Rt TKA on 07/22/20 with PMH significant for OA, prostate cancer, depression, anxiety.    PT Comments    Progressing well with mobility. Reviewed/practiced exercises, gait training, and stair training. Issued stair negotiation handout for family. All education completed. Okay to d/c from PT standpoint.    Follow Up Recommendations  Follow surgeon's recommendation for DC plan and follow-up therapies     Equipment Recommendations  None recommended by PT    Recommendations for Other Services       Precautions / Restrictions Precautions Precautions: Fall Restrictions Weight Bearing Restrictions: No Other Position/Activity Restrictions: WBAT    Mobility  Bed Mobility Overal bed mobility: Modified Independent             General bed mobility comments: oob in recliner  Transfers Overall transfer level: Needs assistance Equipment used: Rolling walker (2 wheeled) Transfers: Sit to/from Stand Sit to Stand: Supervision         General transfer comment: Cues for safety, hand/LE placement. Increased time.  Ambulation/Gait Ambulation/Gait assistance: Min guard Gait Distance (Feet): 120 Feet Assistive device: Rolling walker (2 wheeled) Gait Pattern/deviations: Step-to pattern;Step-through pattern;Decreased stride length     General Gait Details: Min guard for safety. Pt tolerated distance well. Mildly unsteady intermittently. No overt LOB   Stairs Stairs: Yes Stairs assistance: Min assist Stair Management: Backwards;With walker;Step to pattern Number of Stairs: 2 General stair comments: VCs safety, technique, sequence. Min assist to stabilize RW.   Wheelchair Mobility    Modified Rankin (Stroke Patients Only)       Balance Overall balance assessment: Modified  Independent Sitting-balance support: No upper extremity supported;Feet supported Sitting balance-Leahy Scale: Good     Standing balance support: During functional activity Standing balance-Leahy Scale: Fair                              Cognition Arousal/Alertness: Awake/alert Behavior During Therapy: WFL for tasks assessed/performed Overall Cognitive Status: Within Functional Limits for tasks assessed                                        Exercises Total Joint Exercises Ankle Circles/Pumps: AROM;Both;10 reps Quad Sets: AROM;Both;10 reps Heel Slides: AAROM;Right;10 reps Hip ABduction/ADduction: AROM;AAROM;Right;10 reps    General Comments General comments (skin integrity, edema, etc.): Patient able to stand for LB clothing management without UB support. For ambulation patient used walker.      Pertinent Vitals/Pain Pain Assessment: 0-10 Pain Score: 7  Pain Location: Rt knee Pain Descriptors / Indicators: Aching;Discomfort Pain Intervention(s): Monitored during session;Repositioned;Ice applied    Home Living Family/patient expects to be discharged to:: Other (Comment) [employer]) Living Arrangements: Non-relatives/Friends Available Help at Discharge: Friend(s);Neighbor Type of Home: House Home Access: Stairs to enter Entrance Stairs-Rails: None Home Layout: One level Home Equipment: Grab bars - tub/shower;Walker - 2 wheels;Cane - single point;Shower seat Additional Comments: Reports he lives at  shelter/ rents a room. Reports he is able to get up from toilet at shelter with use of sink counter. Has ashower chair if needed.    Prior Function Level of Independence: Independent      Comments: Bike as primary mode of transportation.   PT Goals (current  goals can now be found in the care plan section) Acute Rehab PT Goals Patient Stated Goal: to ride his bike Progress towards PT goals: Progressing toward goals    Frequency     7X/week      PT Plan Current plan remains appropriate    Co-evaluation              AM-PAC PT "6 Clicks" Mobility   Outcome Measure  Help needed turning from your back to your side while in a flat bed without using bedrails?: A Little Help needed moving from lying on your back to sitting on the side of a flat bed without using bedrails?: A Little Help needed moving to and from a bed to a chair (including a wheelchair)?: A Little Help needed standing up from a chair using your arms (e.g., wheelchair or bedside chair)?: A Little Help needed to walk in hospital room?: A Little Help needed climbing 3-5 steps with a railing? : A Little 6 Click Score: 18    End of Session Equipment Utilized During Treatment: Gait belt Activity Tolerance: Patient tolerated treatment well Patient left: in chair;with call bell/phone within reach;with chair alarm set   PT Visit Diagnosis: Other abnormalities of gait and mobility (R26.89);Pain Pain - Right/Left: Right Pain - part of body: Knee     Time: 4967-5916 PT Time Calculation (min) (ACUTE ONLY): 21 min  Charges:  $Gait Training: 8-22 mins                         Doreatha Massed, PT Acute Rehabilitation  Office: [phone] Pager: [phone]

## 2020-07-23 NOTE — Evaluation (Signed)
Occupational Therapy Evaluation Patient Details Name: Michael Rosales MRN: [id number] DOB: 04/13/1951 Today's Date: 07/23/2020    History of Present Illness Patient is 69 y.o. male s/p Rt TKA on 07/22/20 with PMH significant for OA, prostate cancer, depression, anxiety.   Clinical Impression   Michael Rosales is a 69 year old man s/p R TKA with decreased ROM and strength of right lower extremity. On evaluation patient demonstrated ability to perform bed mobility, ambulation and BADLs. Patient provided with education in regards to compensatory strategies for ADLs and safety on return home. Patient verbalized understanding. No further OT needs at this time.    Follow Up Recommendations  No OT follow up    Equipment Recommendations  Tub/shower seat    Recommendations for Other Services       Precautions / Restrictions Precautions Precautions: Fall Restrictions Weight Bearing Restrictions: No Other Position/Activity Restrictions: WBAT      Mobility Bed Mobility Overal bed mobility: Modified Independent             General bed mobility comments: Increased time.  Transfers   Equipment used: Rolling walker (2 wheeled) Transfers: Sit to/from Stand Sit to Stand: Supervision         General transfer comment: Supervisiib to stand from low bed surface. Supervision to ambulate around bed to recliner with RW.    Balance Overall balance assessment: Modified Independent Sitting-balance support: No upper extremity supported;Feet supported Sitting balance-Leahy Scale: Good     Standing balance support: During functional activity Standing balance-Leahy Scale: Fair                             ADL either performed or assessed with clinical judgement   ADL Overall ADL's : Modified independent                                       General ADL Comments: Patient demonstrates ability to donn underwear, shorts, socks and shoes. Therapist provided  instruction to donn lower body clothing with surgery leg first. Patient needed increase time and modified his position on the bed to perform task but no physical assistance needed. Demonstrates the abiity to to rise from low surface - simulating toilet transfers. Patient demonstrates physical abiltiies to perform toiletin task. Therapist and patient discussed use of shower chair at discharge if needed. Patient confident he will be able to perform toilet transfer at home with use of sink counter to push up on. Therapist recommend use of slip on shoes if needed - if needed.     Vision Baseline Vision/History: No visual deficits Vision Assessment?: No apparent visual deficits     Perception     Praxis      Pertinent Vitals/Pain Pain Assessment: 0-10 Pain Score: 6  Pain Location: Rt knee Pain Descriptors / Indicators: Aching;Discomfort Pain Intervention(s): Monitored during session;Premedicated before session;Ice applied     Hand Dominance Right   Extremity/Trunk Assessment Upper Extremity Assessment Upper Extremity Assessment: Overall WFL for tasks assessed   Lower Extremity Assessment Lower Extremity Assessment: Defer to PT evaluation       Communication Communication Communication: No difficulties   Cognition Arousal/Alertness: Awake/alert Behavior During Therapy: WFL for tasks assessed/performed Overall Cognitive Status: Within Functional Limits for tasks assessed  General Comments  Patient able to stand for LB clothing management without UB support. For ambulation patient used walker.    Exercises     Shoulder Instructions      Home Living Family/patient expects to be discharged to:: Other (Comment) [facility]) Living Arrangements: Non-relatives/Friends Available Help at Discharge: Friend(s);Neighbor Type of Home: House Home Access: Stairs to enter [employer] of Steps: 4 Entrance Stairs-Rails:  None Home Layout: One level     Bathroom Shower/Tub: Occupational psychologist: Standard Bathroom Accessibility: Yes   Home Equipment: Grab bars - tub/shower;Walker - 2 wheels;Cane - single point;Shower seat   Additional Comments: Reports he lives at  shelter/ rents a room. Reports he is able to get up from toilet at shelter with use of sink counter. Has ashower chair if needed.      Prior Functioning/Environment Level of Independence: Independent        Comments: Bike as primary mode of transportation.        OT Problem List:        OT Treatment/Interventions:      OT Goals(Current goals can be found in the care plan section) Acute Rehab OT Goals Patient Stated Goal: to ride his bike OT Goal Formulation: All assessment and education complete, DC therapy Potential to Achieve Goals: Good  OT Frequency:     Barriers to D/C:            Co-evaluation              AM-PAC OT "6 Clicks" Daily Activity     Outcome Measure Help from another person eating meals?: None Help from another person taking care of personal grooming?: None Help from another person toileting, which includes using toliet, bedpan, or urinal?: None Help from another person bathing (including washing, rinsing, drying)?: None Help from another person to put on and taking off regular upper body clothing?: None Help from another person to put on and taking off regular lower body clothing?: None 6 Click Score: 24   End of Session Equipment Utilized During Treatment: Rolling walker Nurse Communication:  (okay to see per RN, pt received pain medicine.)  Activity Tolerance: Patient tolerated treatment well Patient left: in chair;with call bell/phone within reach;with chair alarm set  OT Visit Diagnosis: Pain;Muscle weakness (generalized) (M62.81) Pain - Right/Left: Right Pain - part of body: Knee                Time: 3335-4562 OT Time Calculation (min): 17 min Charges:  OT General  Charges $OT Visit: 1 Visit OT Evaluation $OT Eval Low Complexity: 1 Low  Pranav Lince, OTR/L [facility]  Office [phone] Pager: [phone]   Lenward Chancellor 07/23/2020, 9:58 AM

## 2020-07-23 NOTE — Discharge Instructions (Signed)
You may bear weight as tolerated. Keep your dressing on and dry until follow up. Take medicine to prevent blood clots as directed. Take pain medicine as needed with the goal of transitioning to over the counter medicines.  If needed, you may increase breakthrough pain medication (Norco) for the first few days post op - up to 2 tablets every 4 hours.  Stop this medication as soon as you are able.  INSTRUCTIONS AFTER JOINT REPLACEMENT   o Remove items at home which could result in a fall. This includes throw rugs or furniture in walking pathways o ICE to the affected joint every three hours while awake for 30 minutes at a time, for at least the first 3-5 days, and then as needed for pain and swelling.  Continue to use ice for pain and swelling. You may notice swelling that will progress down to the foot and ankle.  This is normal after surgery.  Elevate your leg when you are not up walking on it.   o Continue to use the breathing machine you got in the hospital (incentive spirometer) which will help keep your temperature down.  It is common for your temperature to cycle up and down following surgery, especially at night when you are not up moving around and exerting yourself.  The breathing machine keeps your lungs expanded and your temperature down.   DIET:  As you were doing prior to hospitalization, we recommend a well-balanced diet.  DRESSING / WOUND CARE / SHOWERING  You may shower 3 days after surgery, but keep the wounds dry during showering.  You may use an occlusive plastic wrap (Press'n Seal for example) with blue painter's tape at edges, NO SOAKING/SUBMERGING IN THE BATHTUB.  If the bandage gets wet, change with a clean dry gauze.  If the incision gets wet, pat the wound dry with a clean towel.  ACTIVITY  o Increase activity slowly as tolerated, but follow the weight bearing instructions below.   o No driving for 6 weeks or until further direction given by your physician.  You cannot  drive while taking narcotics.  o No lifting or carrying greater than 10 lbs. until further directed by your surgeon. o Avoid periods of inactivity such as sitting longer than an hour when not asleep. This helps prevent blood clots.  o You may return to work once you are authorized by your doctor.     WEIGHT BEARING   Weight bearing as tolerated with assist device (walker, cane, etc) as directed, use it as long as suggested by your surgeon or therapist, typically at least 4-6 weeks.   EXERCISES  Results after joint replacement surgery are often greatly improved when you follow the exercise, range of motion and muscle strengthening exercises prescribed by your doctor. Safety measures are also important to protect the joint from further injury. Any time any of these exercises cause you to have increased pain or swelling, decrease what you are doing until you are comfortable again and then slowly increase them. If you have problems or questions, call your caregiver or physical therapist for advice.   Rehabilitation is important following a joint replacement. After just a few days of immobilization, the muscles of the leg can become weakened and shrink (atrophy).  These exercises are designed to build up the tone and strength of the thigh and leg muscles and to improve motion. Often times heat used for twenty to thirty minutes before working out will loosen up your tissues and help with   improving the range of motion but do not use heat for the first two weeks following surgery (sometimes heat can increase post-operative swelling).   These exercises can be done on a training (exercise) mat, on the floor, on a table or on a bed. Use whatever works the best and is most comfortable for you.    Use music or television while you are exercising so that the exercises are a pleasant break in your day. This will make your life better with the exercises acting as a break in your routine that you can look forward  to.   Perform all exercises about fifteen times, three times per day or as directed.  You should exercise both the operative leg and the other leg as well.  Exercises include:   . Quad Sets - Tighten up the muscle on the front of the thigh (Quad) and hold for 5-10 seconds.   . Straight Leg Raises - With your knee straight (if you were given a brace, keep it on), lift the leg to 60 degrees, hold for 3 seconds, and slowly lower the leg.  Perform this exercise against resistance later as your leg gets stronger.  . Leg Slides: Lying on your back, slowly slide your foot toward your buttocks, bending your knee up off the floor (only go as far as is comfortable). Then slowly slide your foot back down until your leg is flat on the floor again.  . Angel Wings: Lying on your back spread your legs to the side as far apart as you can without causing discomfort.  . Hamstring Strength:  Lying on your back, push your heel against the floor with your leg straight by tightening up the muscles of your buttocks.  Repeat, but this time bend your knee to a comfortable angle, and push your heel against the floor.  You may put a pillow under the heel to make it more comfortable if necessary.   A rehabilitation program following joint replacement surgery can speed recovery and prevent re-injury in the future due to weakened muscles. Contact your doctor or a physical therapist for more information on knee rehabilitation.    CONSTIPATION  Constipation is defined medically as fewer than three stools per week and severe constipation as less than one stool per week.  Even if you have a regular bowel pattern at home, your normal regimen is likely to be disrupted due to multiple reasons following surgery.  Combination of anesthesia, postoperative narcotics, change in appetite and fluid intake all can affect your bowels.   YOU MUST use at least one of the following options; they are listed in order of increasing strength to get  the job done.  They are all available over the counter, and you may need to use some, POSSIBLY even all of these options:    Drink plenty of fluids (prune juice may be helpful) and high fiber foods Colace 100 mg by mouth twice a day  Senokot for constipation as directed and as needed Dulcolax (bisacodyl), take with full glass of water  Miralax (polyethylene glycol) once or twice a day as needed.  If you have tried all these things and are unable to have a bowel movement in the first 3-4 days after surgery call either your surgeon or your primary doctor.    If you experience loose stools or diarrhea, hold the medications until you stool forms back up.  If your symptoms do not get better within 1 week or if they get worse,   check with your doctor.  If you experience "the worst abdominal pain ever" or develop nausea or vomiting, please contact the office immediately for further recommendations for treatment.   ITCHING:  If you experience itching with your medications, try taking only a single pain pill, or even half a pain pill at a time.  You can also use Benadryl over the counter for itching or also to help with sleep.   TED HOSE STOCKINGS:  Use stockings on both legs until for at least 2 weeks or as directed by physician office. They may be removed at night for sleeping.  MEDICATIONS:  See your medication summary on the "After Visit Summary" that nursing will review with you.  You may have some home medications which will be placed on hold until you complete the course of blood thinner medication.  It is important for you to complete the blood thinner medication as prescribed.  PRECAUTIONS:  If you experience chest pain or shortness of breath - call 911 immediately for transfer to the hospital emergency department.   If you develop a fever greater that 101 F, purulent drainage from wound, increased redness or drainage from wound, foul odor from the wound/dressing, or calf pain - CONTACT YOUR  SURGEON.                                                   FOLLOW-UP APPOINTMENTS:  If you do not already have a post-op appointment, please call the office for an appointment to be seen by your surgeon.  Guidelines for how soon to be seen are listed in your "After Visit Summary", but are typically between 1-4 weeks after surgery.  OTHER INSTRUCTIONS:  Dental Antibiotics:  In most cases prophylactic antibiotics for Dental procdeures after total joint surgery are not necessary.  Exceptions are as follows:  1. History of prior total joint infection  2. Severely immunocompromised (Organ Transplant, cancer chemotherapy, Rheumatoid biologic meds such as Humera)  3. Poorly controlled diabetes (A1C &gt; 8.0, blood glucose over 200)  If you have one of these conditions, contact your surgeon for an antibiotic prescription, prior to your dental procedure.   MAKE SURE YOU:  . Understand these instructions.  . Get help right away if you are not doing well or get worse.    Thank you for letting us be a part of your medical care team.  It is a privilege we respect greatly.  We hope these instructions will help you stay on track for a fast and full recovery!     

## 2020-07-23 NOTE — TOC Transition Note (Signed)
Transition of Care [facility]) - CM/SW Discharge Note   Patient Details  Name: Michael Rosales MRN: [id number] Date of Birth: Sep 20, 1951  Transition [facility] CM/SW Contact:  Lennart Pall, LCSW Phone Number: 07/23/2020, 11:13 AM   Clinical Narrative:    Met with pt the morning to confirm plans for HHPT and receipt of DME (see below).  Pt also notes that he has dc transportation arranged via his insurance.  Plans to dc home with roommate who can provide assist prn.  No further TOC needs.   Final next level of care: [place] Barriers to Discharge: No Barriers Identified   Patient Goals and CMS Choice Patient states their goals for this hospitalization and ongoing recovery are:: hopes to go home today      Discharge Placement                       Discharge Plan and Services                DME Arranged: Continuous passive motion machine, Walker rolling DME Agency: [employer] Date DME Agency Contacted:  (ordered placed prior to surgery)     HH Arranged: PT HH Agency: [facility] [employer] Date [place]:  (orders/referrals placed prior to surgery)      Social Determinants of Health (SDOH) Interventions     Readmission Risk Interventions No flowsheet data found.

## 2020-08-22 ENCOUNTER — Encounter: Payer: Self-pay | Admitting: Physical Therapy

## 2020-08-22 ENCOUNTER — Other Ambulatory Visit: Payer: Self-pay

## 2020-08-22 ENCOUNTER — Ambulatory Visit: Payer: Medicare Other | Attending: Orthopedic Surgery | Admitting: Physical Therapy

## 2020-08-22 DIAGNOSIS — M25561 Pain in right knee: Secondary | ICD-10-CM | POA: Diagnosis present

## 2020-08-22 DIAGNOSIS — M25661 Stiffness of right knee, not elsewhere classified: Secondary | ICD-10-CM | POA: Diagnosis present

## 2020-08-22 DIAGNOSIS — R262 Difficulty in walking, not elsewhere classified: Secondary | ICD-10-CM
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
## 2020-08-26 ENCOUNTER — Other Ambulatory Visit: Payer: Self-pay

## 2020-08-26 ENCOUNTER — Encounter: Payer: Self-pay | Admitting: Physical Therapy

## 2020-08-26 ENCOUNTER — Ambulatory Visit: Payer: Medicare Other | Admitting: Physical Therapy

## 2020-08-26 DIAGNOSIS — M25561 Pain in right knee: Secondary | ICD-10-CM

## 2020-08-26 DIAGNOSIS — M25661 Stiffness of right knee, not elsewhere classified: Secondary | ICD-10-CM

## 2020-08-26 DIAGNOSIS — R262 Difficulty in walking, not elsewhere classified: Secondary | ICD-10-CM
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
## 2020-08-29 ENCOUNTER — Emergency Department (HOSPITAL_COMMUNITY): Payer: Medicare Other

## 2020-08-29 ENCOUNTER — Other Ambulatory Visit: Payer: Self-pay

## 2020-08-29 ENCOUNTER — Emergency Department (HOSPITAL_COMMUNITY)
Admission: EM | Admit: 2020-08-29 | Discharge: 2020-08-30 | Disposition: A | Discharge status: Home / Self Care | Payer: Medicare Other | Attending: Emergency Medicine | Admitting: Emergency Medicine

## 2020-08-29 ENCOUNTER — Encounter (HOSPITAL_COMMUNITY): Payer: Self-pay | Admitting: Emergency Medicine

## 2020-08-29 DIAGNOSIS — F17210 Nicotine dependence, cigarettes, uncomplicated: Secondary | ICD-10-CM | POA: Insufficient documentation

## 2020-08-29 DIAGNOSIS — R519 Headache, unspecified: Secondary | ICD-10-CM | POA: Diagnosis not present

## 2020-08-29 DIAGNOSIS — R55 Syncope and collapse: Secondary | ICD-10-CM | POA: Diagnosis not present

## 2020-08-29 DIAGNOSIS — Z20822 Contact with and (suspected) exposure to covid-19: Secondary | ICD-10-CM | POA: Insufficient documentation

## 2020-08-29 DIAGNOSIS — F10129 Alcohol abuse with intoxication, unspecified: Secondary | ICD-10-CM | POA: Insufficient documentation

## 2020-08-29 DIAGNOSIS — Z8546 Personal history of malignant neoplasm of prostate: Secondary | ICD-10-CM | POA: Diagnosis not present

## 2020-08-29 DIAGNOSIS — Y9355 Activity, bike riding: Secondary | ICD-10-CM | POA: Diagnosis not present

## 2020-08-29 DIAGNOSIS — S4991XA Unspecified injury of right shoulder and upper arm, initial encounter: Secondary | ICD-10-CM | POA: Diagnosis present

## 2020-08-29 DIAGNOSIS — S40211A Abrasion of right shoulder, initial encounter: Secondary | ICD-10-CM | POA: Diagnosis not present

## 2020-08-29 DIAGNOSIS — F10920 Alcohol use, unspecified with intoxication, uncomplicated: Secondary | ICD-10-CM

## 2020-08-29 DIAGNOSIS — I10 Essential (primary) hypertension: Secondary | ICD-10-CM | POA: Insufficient documentation

## 2020-08-29 DIAGNOSIS — Y92410 Unspecified street and highway as the place of occurrence of the external cause: Secondary | ICD-10-CM | POA: Diagnosis not present

## 2020-08-29 HISTORY — DX: Accidental discharge from unspecified firearms or gun, initial encounter: W34.00XA

## 2020-08-29 HISTORY — DX: Essential (primary) hypertension: I10

## 2020-08-29 LAB — COMPREHENSIVE METABOLIC PANEL
ALT: 22 U/L (ref 0–44)
AST: 49 U/L — ABNORMAL HIGH (ref 15–41)
Albumin: 3.5 g/dL (ref 3.5–5.0)
Alkaline Phosphatase: 74 U/L (ref 38–126)
Anion gap: 12 (ref 5–15)
BUN: 9 mg/dL (ref 8–23)
CO2: 18 mmol/L — ABNORMAL LOW (ref 22–32)
Calcium: 9.1 mg/dL (ref 8.9–10.3)
Chloride: 104 mmol/L (ref 98–111)
Creatinine, Ser: 1 mg/dL (ref 0.61–1.24)
GFR calc Af Amer: >60 mL/min (ref >60)
GFR calc non Af Amer: >60 mL/min (ref >60)
Glucose, Bld: 102 mg/dL — ABNORMAL HIGH (ref 70–99)
Potassium: 3.6 mmol/L (ref 3.5–5.1)
Sodium: 134 mmol/L — ABNORMAL LOW (ref 135–145)
Total Bilirubin: 0.5 mg/dL (ref 0.3–1.2)
Total Protein: 7.6 g/dL (ref 6.5–8.1)

## 2020-08-29 LAB — CBC
HCT: 37 % — ABNORMAL LOW (ref 39.0–52.0)
Hemoglobin: 10.9 g/dL — ABNORMAL LOW (ref 13.0–17.0)
MCH: 22.2 pg — ABNORMAL LOW (ref 26.0–34.0)
MCHC: 29.5 g/dL — ABNORMAL LOW (ref 30.0–36.0)
MCV: 75.2 fL — ABNORMAL LOW (ref 80.0–100.0)
Platelets: 245 10*3/uL (ref 150–400)
RBC: 4.92 MIL/uL (ref 4.22–5.81)
RDW: 18.9 % — ABNORMAL HIGH (ref 11.5–15.5)
WBC: 6.1 10*3/uL (ref 4.0–10.5)
nRBC: 0 % (ref 0.0–0.2)

## 2020-08-29 LAB — I-STAT CHEM 8, ED
BUN: 10 mg/dL (ref 8–23)
Calcium, Ion: 1.14 mmol/L — ABNORMAL LOW (ref 1.15–1.40)
Chloride: 102 mmol/L (ref 98–111)
Creatinine, Ser: 1.4 mg/dL — ABNORMAL HIGH (ref 0.61–1.24)
Glucose, Bld: 99 mg/dL (ref 70–99)
HCT: 43 % (ref 39.0–52.0)
Hemoglobin: 14.6 g/dL (ref 13.0–17.0)
Potassium: 3.7 mmol/L (ref 3.5–5.1)
Sodium: 149 mmol/L — ABNORMAL HIGH (ref 135–145)
TCO2: 18 mmol/L — ABNORMAL LOW (ref 22–32)

## 2020-08-29 LAB — ETHANOL: Alcohol, Ethyl (B): 263 mg/dL — ABNORMAL HIGH (ref <10)

## 2020-08-29 LAB — RESPIRATORY PANEL BY RT PCR (FLU A&B, COVID)
Influenza A by PCR: NEGATIVE
Influenza B by PCR: NEGATIVE
SARS Coronavirus 2 by RT PCR: NEGATIVE

## 2020-08-29 LAB — PROTIME-INR
INR: 1.1 (ref 0.8–1.2)
Prothrombin Time: 13.6 seconds (ref 11.4–15.2)

## 2020-08-29 LAB — LACTIC ACID, PLASMA: Lactic Acid, Venous: 2.4 mmol/L (ref 0.5–1.9)

## 2020-08-29 MED ORDER — FENTANYL CITRATE (PF) 100 MCG/2ML IJ SOLN
50.0000 ug | Freq: Once | INTRAMUSCULAR | Status: AC
  Administered 2020-08-29: 50 ug via INTRAVENOUS
  Filled 2020-08-29: qty 2
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
## 2020-08-29 NOTE — ED Notes (Signed)
Patient transported to CT 
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
## 2020-08-30 NOTE — ED Notes (Signed)
Patient advised of discharge and need to call for ride. Pt provided with his wallet and personal cell phone from belongings, reports he will call cab.

## 2020-08-30 NOTE — Progress Notes (Signed)
   08/29/20 2024  Clinical Encounter Type  Visited With Health care provider  Visit Type Trauma  Referral From Nurse  Consult/Referral To Chaplain  Chaplain responded to page. Pt stable no family present, chaplain assistance not needed.  Inform ED Secretary Bo[place] if family members arrive or if needed, please page.   Chaplain Kohei Antonellis Morgan-Simpson  [phone]

## 2020-08-30 NOTE — ED Notes (Signed)
Patient provided with warm blanket, denies further needs at this time.
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
## 2020-09-01 ENCOUNTER — Encounter: Payer: Self-pay | Admitting: Physical Therapy

## 2020-09-02 ENCOUNTER — Other Ambulatory Visit: Payer: Self-pay

## 2020-09-02 ENCOUNTER — Encounter: Payer: Self-pay | Admitting: Physical Therapy

## 2020-09-02 ENCOUNTER — Ambulatory Visit: Payer: Medicare Other | Attending: Orthopedic Surgery | Admitting: Physical Therapy

## 2020-09-02 DIAGNOSIS — M25561 Pain in right knee: Secondary | ICD-10-CM | POA: Diagnosis not present

## 2020-09-02 DIAGNOSIS — M25661 Stiffness of right knee, not elsewhere classified: Secondary | ICD-10-CM | POA: Diagnosis present

## 2020-09-02 DIAGNOSIS — R262 Difficulty in walking, not elsewhere classified: Secondary | ICD-10-CM | POA: Diagnosis present
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
## 2020-09-10 ENCOUNTER — Encounter: Payer: Self-pay | Admitting: Physical Therapy

## 2020-09-10 ENCOUNTER — Ambulatory Visit: Payer: Medicare Other | Admitting: Physical Therapy

## 2020-09-10 ENCOUNTER — Other Ambulatory Visit: Payer: Self-pay

## 2020-09-10 DIAGNOSIS — M25661 Stiffness of right knee, not elsewhere classified: Secondary | ICD-10-CM

## 2020-09-10 DIAGNOSIS — R262 Difficulty in walking, not elsewhere classified: Secondary | ICD-10-CM

## 2020-09-10 DIAGNOSIS — M25561 Pain in right knee: Secondary | ICD-10-CM | POA: Diagnosis not present
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
## 2020-09-12 ENCOUNTER — Encounter: Payer: Self-pay | Admitting: Physical Therapy

## 2020-09-12 ENCOUNTER — Ambulatory Visit: Payer: Medicare Other | Admitting: Physical Therapy

## 2020-09-12 ENCOUNTER — Other Ambulatory Visit: Payer: Self-pay

## 2020-09-12 DIAGNOSIS — R262 Difficulty in walking, not elsewhere classified: Secondary | ICD-10-CM

## 2020-09-12 DIAGNOSIS — M25561 Pain in right knee: Secondary | ICD-10-CM | POA: Diagnosis not present

## 2020-09-12 DIAGNOSIS — M25661 Stiffness of right knee, not elsewhere classified: Secondary | ICD-10-CM
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
## 2020-09-16 ENCOUNTER — Ambulatory Visit: Payer: Medicare Other | Admitting: Physical Therapy

## 2020-09-18 ENCOUNTER — Ambulatory Visit: Payer: Medicare Other | Admitting: Physical Therapy

## 2020-09-18 ENCOUNTER — Other Ambulatory Visit: Payer: Self-pay

## 2020-09-18 ENCOUNTER — Encounter: Payer: Self-pay | Admitting: Physical Therapy

## 2020-09-18 DIAGNOSIS — M25661 Stiffness of right knee, not elsewhere classified: Secondary | ICD-10-CM

## 2020-09-18 DIAGNOSIS — M25561 Pain in right knee: Secondary | ICD-10-CM | POA: Diagnosis not present

## 2020-09-18 DIAGNOSIS — R262 Difficulty in walking, not elsewhere classified: Secondary | ICD-10-CM
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
## 2020-09-23 ENCOUNTER — Other Ambulatory Visit: Payer: Self-pay

## 2020-09-23 ENCOUNTER — Encounter: Payer: Self-pay | Admitting: Physical Therapy

## 2020-09-23 ENCOUNTER — Ambulatory Visit: Payer: Medicare Other | Admitting: Physical Therapy

## 2020-09-23 DIAGNOSIS — M25661 Stiffness of right knee, not elsewhere classified: Secondary | ICD-10-CM

## 2020-09-23 DIAGNOSIS — R262 Difficulty in walking, not elsewhere classified: Secondary | ICD-10-CM

## 2020-09-23 DIAGNOSIS — M25561 Pain in right knee: Secondary | ICD-10-CM
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
## 2020-09-25 ENCOUNTER — Ambulatory Visit: Payer: Medicare Other | Admitting: Physical Therapy

## 2020-09-30 ENCOUNTER — Ambulatory Visit: Payer: Medicare Other | Admitting: Physical Therapy

## 2020-10-02 ENCOUNTER — Other Ambulatory Visit: Payer: Self-pay

## 2020-10-02 ENCOUNTER — Ambulatory Visit: Payer: Medicare Other | Attending: Orthopedic Surgery | Admitting: Physical Therapy

## 2020-10-02 ENCOUNTER — Encounter: Payer: Self-pay | Admitting: Physical Therapy

## 2020-10-02 DIAGNOSIS — M25561 Pain in right knee: Secondary | ICD-10-CM | POA: Diagnosis present

## 2020-10-02 DIAGNOSIS — R262 Difficulty in walking, not elsewhere classified: Secondary | ICD-10-CM | POA: Diagnosis present

## 2020-10-02 DIAGNOSIS — M25661 Stiffness of right knee, not elsewhere classified: Secondary | ICD-10-CM | POA: Diagnosis present
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
## 2020-10-07 ENCOUNTER — Other Ambulatory Visit: Payer: Self-pay

## 2020-10-07 ENCOUNTER — Encounter: Payer: Self-pay | Admitting: Physical Therapy

## 2020-10-07 ENCOUNTER — Ambulatory Visit: Payer: Medicare Other | Admitting: Physical Therapy

## 2020-10-07 DIAGNOSIS — R262 Difficulty in walking, not elsewhere classified: Secondary | ICD-10-CM

## 2020-10-07 DIAGNOSIS — M25561 Pain in right knee: Secondary | ICD-10-CM | POA: Diagnosis not present

## 2020-10-07 DIAGNOSIS — M25661 Stiffness of right knee, not elsewhere classified: Secondary | ICD-10-CM
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
## 2020-10-09 ENCOUNTER — Ambulatory Visit: Payer: Medicare Other | Admitting: Physical Therapy

## 2020-10-14 ENCOUNTER — Ambulatory Visit: Payer: Medicare Other | Admitting: Physical Therapy

## 2020-10-14 ENCOUNTER — Encounter: Payer: Self-pay | Admitting: Physical Therapy

## 2020-10-14 ENCOUNTER — Other Ambulatory Visit: Payer: Self-pay

## 2020-10-14 DIAGNOSIS — R262 Difficulty in walking, not elsewhere classified: Secondary | ICD-10-CM

## 2020-10-14 DIAGNOSIS — M25561 Pain in right knee: Secondary | ICD-10-CM

## 2020-10-14 DIAGNOSIS — M25661 Stiffness of right knee, not elsewhere classified: Secondary | ICD-10-CM
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
## 2020-10-16 ENCOUNTER — Encounter: Payer: Medicare Other | Admitting: Physical Therapy

## 2020-10-30 ENCOUNTER — Ambulatory Visit: Payer: Medicare Other | Attending: Orthopedic Surgery | Admitting: Physical Therapy

## 2020-10-30 ENCOUNTER — Encounter: Payer: Self-pay | Admitting: Physical Therapy

## 2020-10-30 ENCOUNTER — Other Ambulatory Visit: Payer: Self-pay

## 2020-10-30 DIAGNOSIS — R262 Difficulty in walking, not elsewhere classified: Secondary | ICD-10-CM | POA: Diagnosis present

## 2020-10-30 DIAGNOSIS — M25661 Stiffness of right knee, not elsewhere classified: Secondary | ICD-10-CM | POA: Insufficient documentation

## 2020-10-30 DIAGNOSIS — M25561 Pain in right knee: Secondary | ICD-10-CM | POA: Insufficient documentation
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
## 2020-11-06 ENCOUNTER — Ambulatory Visit: Payer: Medicare Other | Admitting: Physical Therapy

## 2020-11-13 ENCOUNTER — Other Ambulatory Visit: Payer: Self-pay

## 2020-11-13 ENCOUNTER — Ambulatory Visit: Payer: Medicare Other | Admitting: Physical Therapy

## 2020-11-13 ENCOUNTER — Encounter: Payer: Self-pay | Admitting: Physical Therapy

## 2020-11-13 DIAGNOSIS — M25561 Pain in right knee: Secondary | ICD-10-CM

## 2020-11-13 DIAGNOSIS — M25661 Stiffness of right knee, not elsewhere classified: Secondary | ICD-10-CM

## 2020-11-13 DIAGNOSIS — R262 Difficulty in walking, not elsewhere classified: Secondary | ICD-10-CM
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
## 2020-11-20 ENCOUNTER — Ambulatory Visit: Payer: Self-pay | Admitting: Surgery

## 2021-01-15 ENCOUNTER — Emergency Department (HOSPITAL_COMMUNITY)
Admission: EM | Admit: 2021-01-15 | Discharge: 2021-01-15 | Disposition: A | Discharge status: Home / Self Care | Payer: Medicare Other | Attending: Emergency Medicine | Admitting: Emergency Medicine

## 2021-01-15 ENCOUNTER — Other Ambulatory Visit: Payer: Self-pay

## 2021-01-15 ENCOUNTER — Encounter (HOSPITAL_COMMUNITY): Payer: Self-pay

## 2021-01-15 DIAGNOSIS — H538 Other visual disturbances: Secondary | ICD-10-CM | POA: Insufficient documentation

## 2021-01-15 DIAGNOSIS — Z8546 Personal history of malignant neoplasm of prostate: Secondary | ICD-10-CM | POA: Diagnosis not present

## 2021-01-15 DIAGNOSIS — Z96649 Presence of unspecified artificial hip joint: Secondary | ICD-10-CM | POA: Insufficient documentation

## 2021-01-15 DIAGNOSIS — R519 Headache, unspecified: Secondary | ICD-10-CM | POA: Insufficient documentation

## 2021-01-15 DIAGNOSIS — F17210 Nicotine dependence, cigarettes, uncomplicated: Secondary | ICD-10-CM | POA: Diagnosis not present

## 2021-01-15 DIAGNOSIS — R112 Nausea with vomiting, unspecified: Secondary | ICD-10-CM | POA: Diagnosis not present

## 2021-01-15 DIAGNOSIS — I10 Essential (primary) hypertension: Secondary | ICD-10-CM | POA: Insufficient documentation

## 2021-01-15 DIAGNOSIS — R531 Weakness: Secondary | ICD-10-CM | POA: Diagnosis not present

## 2021-01-15 DIAGNOSIS — Z96651 Presence of right artificial knee joint: Secondary | ICD-10-CM | POA: Insufficient documentation

## 2021-01-15 DIAGNOSIS — H539 Unspecified visual disturbance: Secondary | ICD-10-CM

## 2021-01-15 NOTE — Discharge Instructions (Addendum)
As discussed, it is important that you follow-up with your ophthalmologist tomorrow morning as scheduled.  Please continue to use your medication as directed.  Return here for concerning changes in your condition.
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
## 2021-01-15 NOTE — ED Notes (Signed)
BIB [facility]. Cataract surgery today at 1000, sent home with eye drops. States he took the eye drops, and now he has diminished vision through the right eye. After vision deficit patient reports onset of headache.

## 2021-01-15 NOTE — ED Triage Notes (Signed)
Patient had right eye cataract surgery today with no complications and complains of headache. Alert and oriented, NAD

## 2021-02-07 ENCOUNTER — Other Ambulatory Visit (HOSPITAL_COMMUNITY)
Admission: RE | Admit: 2021-02-07 | Discharge: 2021-02-07 | Disposition: A | Discharge status: Home / Self Care | Payer: Medicare Other | Source: Ambulatory Visit | Attending: Surgery | Admitting: Surgery

## 2021-02-07 DIAGNOSIS — Z01812 Encounter for preprocedural laboratory examination: Secondary | ICD-10-CM | POA: Diagnosis present

## 2021-02-07 DIAGNOSIS — Z20822 Contact with and (suspected) exposure to covid-19: Secondary | ICD-10-CM | POA: Diagnosis not present

## 2021-02-07 LAB — SARS CORONAVIRUS 2 (TAT 6-24 HRS): SARS Coronavirus 2: NEGATIVE

## 2021-02-09 ENCOUNTER — Encounter (HOSPITAL_COMMUNITY): Payer: Self-pay | Admitting: Surgery

## 2021-02-09 ENCOUNTER — Ambulatory Visit: Payer: Self-pay | Admitting: Surgery

## 2021-02-09 ENCOUNTER — Other Ambulatory Visit: Payer: Self-pay

## 2021-02-09 NOTE — Anesthesia Preprocedure Evaluation (Signed)
Anesthesia Evaluation  Patient identified by MRN, date of birth, ID band Patient awake    Reviewed: Allergy & Precautions, NPO status , Patient's Chart, lab work & pertinent test results  History of Anesthesia Complications (+) DIFFICULT AIRWAY and history of anesthetic complications  Airway Mallampati: II  TM Distance: >3 FB Neck ROM: Full    Dental  (+) Teeth Intact, Dental Advisory Given   Pulmonary Current Smoker and Patient abstained from smoking.,    Pulmonary exam normal        Cardiovascular hypertension, Normal cardiovascular exam     Neuro/Psych PSYCHIATRIC DISORDERS Anxiety Depression negative neurological ROS     GI/Hepatic GERD  Medicated,(+)     substance abuse  alcohol use, Incisional hernia    Endo/Other  negative endocrine ROS  Renal/GU negative Renal ROS     Musculoskeletal  (+) Arthritis , Osteoarthritis,    Abdominal   Peds  Hematology  (+) Blood dyscrasia, anemia ,   Anesthesia Other Findings   Reproductive/Obstetrics                           Anesthesia Physical Anesthesia Plan  ASA: II  Anesthesia Plan: General   Post-op Pain Management:    Induction: Intravenous  PONV Risk Score and Plan: 2 and Midazolam, Dexamethasone, Ondansetron and Treatment may vary due to age or medical condition  Airway Management Planned: Oral ETT and Video Laryngoscope Planned  Additional Equipment:   Intra-op Plan:   Post-operative Plan: Extubation in OR  Informed Consent: I have reviewed the patients History and Physical, chart, labs and discussed the procedure including the risks, benefits and alternatives for the proposed anesthesia with the patient or authorized representative who has indicated his/her understanding and acceptance.     Dental advisory given  Plan Discussed with: CRNA  Anesthesia Plan Comments: (H/o difficult airway.  Per anesthesia note  01/26/2019, "First attempt with LMA#5 with poor seal despite repositioning x2. Elected to paralyze and place ETT. First intubation attempt by myself with Sabra Heck #2 blade with grade 3 view, swollen glottic tissues noted with no noted trauma. (Note: pt previously noted isolated symptom of sore throat x2 days) Mask ventilated until Glidescope available. Grade 2 view with Glidescope, anterior airway noted. Atraumatic intubation after some difficulty angling ETT anterior through cords. Recommend Glidescope intubation in future with availability of fiberoptic scope if needed.")      Anesthesia Quick Evaluation

## 2021-02-09 NOTE — Progress Notes (Signed)
PCP - Dr. Owens Shark at [facility]  Cardiologist - denies   Chest x-ray -  EKG - 08/29/20 Stress Test -  ECHO -  Cardiac Cath -   Blood Thinner Instructions:  Aspirin Instructions: ASA none DOS     COVID TEST- 02/07/21 negative   Anesthesia review: yes - difficult airway  Coronavirus Screening  Have you experienced the following symptoms:  Cough yes/no: No Fever (>100.30F)  yes/no: No Runny nose yes/no: No Sore throat yes/no: No Difficulty breathing/shortness of breath  yes/no: No  Have you or a family member traveled in the last 14 days and where? yes/no: No   If the patient indicates "YES" to the above questions, their PAT will be rescheduled to limit the exposure to others and, the surgeon will be notified. THE PATIENT WILL NEED TO BE ASYMPTOMATIC FOR 14 DAYS.   If the patient is not experiencing any of these symptoms, the PAT nurse will instruct them to NOT bring anyone with them to their appointment since they may have these symptoms or traveled as well.   Please remind your patients and families that hospital visitation restrictions are in effect and the importance of the restrictions.    -------------  SDW INSTRUCTIONS:  Your procedure is scheduled on 02/10/21. Please report to [facility] Main Entrance "A" at 0930 A.M., and check in at the Admitting office. Call this number if you have problems the morning of surgery: [phone]   Remember: Do not eat or drink after midnight the night before your surgery   Medications to take morning of surgery with a sip of water include: esomeprazole (NEXIUM) Eye drops  As of today, STOP taking any Aspirin (unless otherwise instructed by your surgeon), Aleve, Naproxen, Ibuprofen, Motrin, Advil, Goody's, BC's, all herbal medications, fish oil, and all vitamins.    The Morning of Surgery Do not wear jewelry Do not wear lotions, powders, colognes, or deodorant Men may shave face and neck. Do not bring valuables  to the hospital. [facility] is not responsible for any belongings or valuables. If you are a smoker, DO NOT Smoke 24 hours prior to surgery If you wear a CPAP at night please bring your mask the morning of surgery  Remember that you must have someone to transport you home after your surgery, and remain with you for 24 hours if you are discharged the same day. Please bring cases for contacts, glasses, hearing aids, dentures or bridgework because it cannot be worn into surgery.   Patients discharged the day of surgery will not be allowed to drive home.   Please shower the NIGHT BEFORE SURGERY and the MORNING OF SURGERY with DIAL Soap. Wear comfortable clothes the morning of surgery. Oral Hygiene is also important to reduce your risk of infection.  Remember - BRUSH YOUR TEETH THE MORNING OF SURGERY WITH YOUR REGULAR TOOTHPASTE  Patient denies shortness of breath, fever, cough and chest pain.

## 2021-02-10 ENCOUNTER — Inpatient Hospital Stay (HOSPITAL_COMMUNITY): Payer: Medicare Other | Admitting: Anesthesiology

## 2021-02-10 ENCOUNTER — Encounter (HOSPITAL_COMMUNITY)
Admission: RE | Disposition: A | Discharge status: Home / Self Care | Payer: Self-pay | Source: Home / Self Care | Attending: Surgery

## 2021-02-10 ENCOUNTER — Encounter (HOSPITAL_COMMUNITY): Payer: Self-pay | Admitting: Surgery

## 2021-02-10 ENCOUNTER — Inpatient Hospital Stay (HOSPITAL_COMMUNITY)
Admission: RE | Admit: 2021-02-10 | Discharge: 2021-02-11 | DRG: 337 | Disposition: A | Discharge status: Home / Self Care | Payer: Medicare Other | Attending: Surgery | Admitting: Surgery

## 2021-02-10 DIAGNOSIS — Z8546 Personal history of malignant neoplasm of prostate: Secondary | ICD-10-CM

## 2021-02-10 DIAGNOSIS — K430 Incisional hernia with obstruction, without gangrene: Principal | ICD-10-CM | POA: Diagnosis present

## 2021-02-10 DIAGNOSIS — F419 Anxiety disorder, unspecified: Secondary | ICD-10-CM | POA: Diagnosis present

## 2021-02-10 DIAGNOSIS — F32A Depression, unspecified: Secondary | ICD-10-CM | POA: Diagnosis present

## 2021-02-10 DIAGNOSIS — K439 Ventral hernia without obstruction or gangrene: Secondary | ICD-10-CM | POA: Diagnosis present

## 2021-02-10 DIAGNOSIS — F17210 Nicotine dependence, cigarettes, uncomplicated: Secondary | ICD-10-CM | POA: Diagnosis present

## 2021-02-10 DIAGNOSIS — I10 Essential (primary) hypertension: Secondary | ICD-10-CM | POA: Diagnosis present

## 2021-02-10 DIAGNOSIS — M1990 Unspecified osteoarthritis, unspecified site: Secondary | ICD-10-CM | POA: Diagnosis present

## 2021-02-10 HISTORY — PX: LAPAROSCOPY: SHX197

## 2021-02-10 HISTORY — PX: VENTRAL HERNIA REPAIR: SHX424

## 2021-02-10 LAB — CBC
HCT: 34.9 % — ABNORMAL LOW (ref 39.0–52.0)
Hemoglobin: 10.6 g/dL — ABNORMAL LOW (ref 13.0–17.0)
MCH: 24.3 pg — ABNORMAL LOW (ref 26.0–34.0)
MCHC: 30.4 g/dL (ref 30.0–36.0)
MCV: 79.9 fL — ABNORMAL LOW (ref 80.0–100.0)
Platelets: 312 10*3/uL (ref 150–400)
RBC: 4.37 MIL/uL (ref 4.22–5.81)
RDW: 15.2 % (ref 11.5–15.5)
WBC: 6 10*3/uL (ref 4.0–10.5)
nRBC: 0 % (ref 0.0–0.2)

## 2021-02-10 LAB — BASIC METABOLIC PANEL
Anion gap: 8 (ref 5–15)
BUN: 10 mg/dL (ref 8–23)
CO2: 20 mmol/L — ABNORMAL LOW (ref 22–32)
Calcium: 8.9 mg/dL (ref 8.9–10.3)
Chloride: 108 mmol/L (ref 98–111)
Creatinine, Ser: 0.61 mg/dL (ref 0.61–1.24)
GFR, Estimated: >60 mL/min (ref >60)
Glucose, Bld: 105 mg/dL — ABNORMAL HIGH (ref 70–99)
Potassium: 3.7 mmol/L (ref 3.5–5.1)
Sodium: 136 mmol/L (ref 135–145)

## 2021-02-10 LAB — HIV ANTIBODY (ROUTINE TESTING W REFLEX): HIV Screen 4th Generation wRfx: NONREACTIVE

## 2021-02-10 SURGERY — REPAIR, HERNIA, VENTRAL, LAPAROSCOPIC
Anesthesia: General | Site: Abdomen

## 2021-02-10 MED ORDER — OXYCODONE HCL 5 MG/5ML PO SOLN: 5.0000 mg | Freq: Once | ORAL | Status: AC | PRN

## 2021-02-10 MED ORDER — LIDOCAINE 2% (20 MG/ML) 5 ML SYRINGE
INTRAMUSCULAR | Status: DC | PRN
  Administered 2021-02-10: 100 mg via INTRAVENOUS

## 2021-02-10 MED ORDER — ONDANSETRON HCL 4 MG/2ML IJ SOLN: 4.0000 mg | Freq: Once | INTRAMUSCULAR | Status: DC | PRN

## 2021-02-10 MED ORDER — DOCUSATE SODIUM 100 MG PO CAPS
100.0000 mg | ORAL_CAPSULE | Freq: Two times a day (BID) | ORAL | Status: DC
  Administered 2021-02-10 – 2021-02-11 (×2): 100 mg via ORAL
  Filled 2021-02-10 (×3): qty 1

## 2021-02-10 MED ORDER — SUGAMMADEX SODIUM 200 MG/2ML IV SOLN
INTRAVENOUS | Status: DC | PRN
  Administered 2021-02-10: 200 mg via INTRAVENOUS

## 2021-02-10 MED ORDER — DEXMEDETOMIDINE (PRECEDEX) IN NS 20 MCG/5ML (4 MCG/ML) IV SYRINGE
PREFILLED_SYRINGE | INTRAVENOUS | Status: AC
  Filled 2021-02-10: qty 5

## 2021-02-10 MED ORDER — METHOCARBAMOL 500 MG PO TABS
1000.0000 mg | ORAL_TABLET | Freq: Three times a day (TID) | ORAL | Status: DC
  Administered 2021-02-10 – 2021-02-11 (×3): 1000 mg via ORAL
  Filled 2021-02-10 (×3): qty 2

## 2021-02-10 MED ORDER — OXYCODONE HCL 5 MG PO TABS: 5.0000 mg | ORAL_TABLET | Freq: Once | ORAL | Status: AC | PRN

## 2021-02-10 MED ORDER — SUCCINYLCHOLINE CHLORIDE 200 MG/10ML IV SOSY
PREFILLED_SYRINGE | INTRAVENOUS | Status: AC
  Filled 2021-02-10: qty 10

## 2021-02-10 MED ORDER — OXYCODONE HCL 5 MG PO TABS
ORAL_TABLET | ORAL | Status: AC
  Administered 2021-02-10: 5 mg via ORAL
  Filled 2021-02-10: qty 1

## 2021-02-10 MED ORDER — ONDANSETRON HCL 4 MG/2ML IJ SOLN
INTRAMUSCULAR | Status: DC | PRN
  Administered 2021-02-10: 4 mg via INTRAVENOUS

## 2021-02-10 MED ORDER — CHLORHEXIDINE GLUCONATE CLOTH 2 % EX PADS: 6.0000 | MEDICATED_PAD | Freq: Once | CUTANEOUS | Status: DC

## 2021-02-10 MED ORDER — FENTANYL CITRATE (PF) 250 MCG/5ML IJ SOLN
INTRAMUSCULAR | Status: DC | PRN
  Administered 2021-02-10: 100 ug via INTRAVENOUS
  Administered 2021-02-10: 50 ug via INTRAVENOUS

## 2021-02-10 MED ORDER — MIDAZOLAM HCL 2 MG/2ML IJ SOLN
INTRAMUSCULAR | Status: AC
  Filled 2021-02-10: qty 2

## 2021-02-10 MED ORDER — DEXAMETHASONE SODIUM PHOSPHATE 10 MG/ML IJ SOLN
INTRAMUSCULAR | Status: DC | PRN
  Administered 2021-02-10: 10 mg via INTRAVENOUS

## 2021-02-10 MED ORDER — ENOXAPARIN SODIUM 40 MG/0.4ML ~~LOC~~ SOLN: 40.0000 mg | SUBCUTANEOUS | Status: DC

## 2021-02-10 MED ORDER — PROPOFOL 10 MG/ML IV BOLUS
INTRAVENOUS | Status: AC
  Filled 2021-02-10: qty 40

## 2021-02-10 MED ORDER — SUGAMMADEX SODIUM 200 MG/2ML IV SOLN: INTRAVENOUS | Status: DC | PRN

## 2021-02-10 MED ORDER — ROCURONIUM BROMIDE 10 MG/ML (PF) SYRINGE
PREFILLED_SYRINGE | INTRAVENOUS | Status: AC
  Filled 2021-02-10: qty 10

## 2021-02-10 MED ORDER — ACETAMINOPHEN 500 MG PO TABS: 1000.0000 mg | ORAL_TABLET | Freq: Once | ORAL | Status: AC

## 2021-02-10 MED ORDER — AMISULPRIDE (ANTIEMETIC) 5 MG/2ML IV SOLN: 10.0000 mg | Freq: Once | INTRAVENOUS | Status: DC | PRN

## 2021-02-10 MED ORDER — OXYCODONE HCL 5 MG/5ML PO SOLN
5.0000 mg | ORAL | Status: DC | PRN
  Administered 2021-02-10 – 2021-02-11 (×3): 10 mg via ORAL
  Filled 2021-02-10 (×3): qty 10

## 2021-02-10 MED ORDER — ONDANSETRON HCL 4 MG/2ML IJ SOLN
INTRAMUSCULAR | Status: AC
  Filled 2021-02-10: qty 2

## 2021-02-10 MED ORDER — ONDANSETRON HCL 4 MG/2ML IJ SOLN: 4.0000 mg | Freq: Four times a day (QID) | INTRAMUSCULAR | Status: DC | PRN

## 2021-02-10 MED ORDER — HEPARIN SODIUM (PORCINE) 5000 UNIT/ML IJ SOLN
INTRAMUSCULAR | Status: AC
  Administered 2021-02-10: 5000 [IU] via SUBCUTANEOUS
  Filled 2021-02-10: qty 1

## 2021-02-10 MED ORDER — CEFAZOLIN SODIUM-DEXTROSE 2-4 GM/100ML-% IV SOLN
2.0000 g | INTRAVENOUS | Status: AC
  Administered 2021-02-10: 2 g via INTRAVENOUS

## 2021-02-10 MED ORDER — FENTANYL CITRATE (PF) 100 MCG/2ML IJ SOLN
INTRAMUSCULAR | Status: AC
  Administered 2021-02-10: 50 ug via INTRAVENOUS
  Filled 2021-02-10: qty 2

## 2021-02-10 MED ORDER — HEPARIN SODIUM (PORCINE) 5000 UNIT/ML IJ SOLN: 5000.0000 [IU] | Freq: Once | INTRAMUSCULAR | Status: AC

## 2021-02-10 MED ORDER — ROCURONIUM BROMIDE 10 MG/ML (PF) SYRINGE
PREFILLED_SYRINGE | INTRAVENOUS | Status: DC | PRN
  Administered 2021-02-10: 30 mg via INTRAVENOUS
  Administered 2021-02-10 (×2): 20 mg via INTRAVENOUS

## 2021-02-10 MED ORDER — FENTANYL CITRATE (PF) 100 MCG/2ML IJ SOLN
25.0000 ug | INTRAMUSCULAR | Status: DC | PRN
  Administered 2021-02-10: 50 ug via INTRAVENOUS

## 2021-02-10 MED ORDER — ONDANSETRON 4 MG PO TBDP: 4.0000 mg | ORAL_TABLET | Freq: Four times a day (QID) | ORAL | Status: DC | PRN

## 2021-02-10 MED ORDER — CEFAZOLIN SODIUM-DEXTROSE 2-4 GM/100ML-% IV SOLN
INTRAVENOUS | Status: AC
  Filled 2021-02-10: qty 100

## 2021-02-10 MED ORDER — LACTATED RINGERS IV SOLN: INTRAVENOUS | Status: DC

## 2021-02-10 MED ORDER — DEXAMETHASONE SODIUM PHOSPHATE 10 MG/ML IJ SOLN
INTRAMUSCULAR | Status: AC
  Filled 2021-02-10: qty 1

## 2021-02-10 MED ORDER — FENTANYL CITRATE (PF) 250 MCG/5ML IJ SOLN
INTRAMUSCULAR | Status: AC
  Filled 2021-02-10: qty 5

## 2021-02-10 MED ORDER — MIDAZOLAM HCL 2 MG/2ML IJ SOLN
INTRAMUSCULAR | Status: DC | PRN
  Administered 2021-02-10: 2 mg via INTRAVENOUS

## 2021-02-10 MED ORDER — 0.9 % SODIUM CHLORIDE (POUR BTL) OPTIME
TOPICAL | Status: DC | PRN
  Administered 2021-02-10: 1000 mL

## 2021-02-10 MED ORDER — ORAL CARE MOUTH RINSE: 15.0000 mL | Freq: Once | OROMUCOSAL | Status: AC

## 2021-02-10 MED ORDER — CHLORHEXIDINE GLUCONATE 0.12 % MT SOLN: 15.0000 mL | Freq: Once | OROMUCOSAL | Status: AC

## 2021-02-10 MED ORDER — ACETAMINOPHEN 500 MG PO TABS
1000.0000 mg | ORAL_TABLET | Freq: Four times a day (QID) | ORAL | Status: DC
  Administered 2021-02-10 – 2021-02-11 (×3): 1000 mg via ORAL
  Filled 2021-02-10 (×3): qty 2

## 2021-02-10 MED ORDER — CHLORHEXIDINE GLUCONATE 0.12 % MT SOLN
OROMUCOSAL | Status: AC
  Administered 2021-02-10: 15 mL via OROMUCOSAL
  Filled 2021-02-10: qty 15

## 2021-02-10 MED ORDER — SUCCINYLCHOLINE CHLORIDE 200 MG/10ML IV SOSY
PREFILLED_SYRINGE | INTRAVENOUS | Status: DC | PRN
  Administered 2021-02-10: 100 mg via INTRAVENOUS

## 2021-02-10 MED ORDER — PROPOFOL 10 MG/ML IV BOLUS
INTRAVENOUS | Status: DC | PRN
  Administered 2021-02-10: 150 mg via INTRAVENOUS

## 2021-02-10 MED ORDER — DEXMEDETOMIDINE HCL 200 MCG/2ML IV SOLN
INTRAVENOUS | Status: DC | PRN
  Administered 2021-02-10 (×2): 4 ug via INTRAVENOUS

## 2021-02-10 MED ORDER — LIDOCAINE 2% (20 MG/ML) 5 ML SYRINGE
INTRAMUSCULAR | Status: AC
  Filled 2021-02-10: qty 5

## 2021-02-10 MED ORDER — ACETAMINOPHEN 500 MG PO TABS
ORAL_TABLET | ORAL | Status: AC
  Administered 2021-02-10: 1000 mg via ORAL
  Filled 2021-02-10: qty 2

## 2021-02-10 SURGICAL SUPPLY — 48 items
BINDER ABDOMINAL 12 ML 46-62 (SOFTGOODS) IMPLANT
CANISTER SUCT 3000ML PPV (MISCELLANEOUS) IMPLANT
CHLORAPREP W/TINT 26 (MISCELLANEOUS) ×3 IMPLANT
COVER SURGICAL LIGHT HANDLE (MISCELLANEOUS) ×3 IMPLANT
COVER WAND RF STERILE (DRAPES) ×3 IMPLANT
DERMABOND ADVANCED (GAUZE/BANDAGES/DRESSINGS) ×1
DERMABOND ADVANCED .7 DNX12 (GAUZE/BANDAGES/DRESSINGS) ×2 IMPLANT
DEVICE SECURE STRAP 25 ABSORB (INSTRUMENTS) IMPLANT
DEVICE TROCAR PUNCTURE CLOSURE (ENDOMECHANICALS) IMPLANT
ELECT CAUTERY BLADE 6.4 (BLADE) ×3 IMPLANT
ELECT REM PT RETURN 9FT ADLT (ELECTROSURGICAL) ×3
ELECTRODE REM PT RTRN 9FT ADLT (ELECTROSURGICAL) ×2 IMPLANT
GLOVE BIO SURGEON STRL SZ 6.5 (GLOVE) ×3 IMPLANT
GLOVE BIOGEL PI IND STRL 6 (GLOVE) ×2 IMPLANT
GLOVE BIOGEL PI INDICATOR 6 (GLOVE) ×1
GOWN STRL REUS W/ TWL LRG LVL3 (GOWN DISPOSABLE) ×6 IMPLANT
GOWN STRL REUS W/TWL LRG LVL3 (GOWN DISPOSABLE) ×3
GRASPER SUT TROCAR 14GX15 (MISCELLANEOUS) IMPLANT
KIT BASIN OR (CUSTOM PROCEDURE TRAY) ×3 IMPLANT
KIT TURNOVER KIT B (KITS) ×3 IMPLANT
MARKER SKIN DUAL TIP RULER LAB (MISCELLANEOUS) ×3 IMPLANT
MESH VENTRALEX ST 8CM LRG (Mesh General) ×3 IMPLANT
NEEDLE INSUFFLATION 14GA 120MM (NEEDLE) ×3 IMPLANT
NEEDLE SPNL 22GX3.5 QUINCKE BK (NEEDLE) IMPLANT
NS IRRIG 1000ML POUR BTL (IV SOLUTION) ×3 IMPLANT
PAD ARMBOARD 7.5X6 YLW CONV (MISCELLANEOUS) ×6 IMPLANT
PENCIL BUTTON HOLSTER BLD 10FT (ELECTRODE) ×3 IMPLANT
SCISSORS LAP 5X35 DISP (ENDOMECHANICALS) IMPLANT
SET IRRIG TUBING LAPAROSCOPIC (IRRIGATION / IRRIGATOR) IMPLANT
SET TUBE SMOKE EVAC HIGH FLOW (TUBING) ×3 IMPLANT
SHEARS HARMONIC ACE PLUS 36CM (ENDOMECHANICALS) IMPLANT
SLEEVE ENDOPATH XCEL 5M (ENDOMECHANICALS) ×3 IMPLANT
SUT ETHIBOND CT1 BRD #0 30IN (SUTURE) ×6 IMPLANT
SUT MNCRL AB 4-0 PS2 18 (SUTURE) ×3 IMPLANT
SUT NOVA NAB DX-16 0-1 5-0 T12 (SUTURE) ×9 IMPLANT
SUT PROLENE 0 CT 1 CR/8 (SUTURE) ×3 IMPLANT
SUT VIC AB 3-0 SH 27 (SUTURE) ×1
SUT VIC AB 3-0 SH 27XBRD (SUTURE) ×2 IMPLANT
TOWEL GREEN STERILE (TOWEL DISPOSABLE) ×3 IMPLANT
TOWEL GREEN STERILE FF (TOWEL DISPOSABLE) ×3 IMPLANT
TRAY FOLEY W/BAG SLVR 16FR (SET/KITS/TRAYS/PACK)
TRAY FOLEY W/BAG SLVR 16FR ST (SET/KITS/TRAYS/PACK) IMPLANT
TRAY LAPAROSCOPIC MC (CUSTOM PROCEDURE TRAY) ×3 IMPLANT
TROCAR XCEL BLUNT TIP 100MML (ENDOMECHANICALS) IMPLANT
TROCAR XCEL NON-BLD 11X100MML (ENDOMECHANICALS) IMPLANT
TROCAR XCEL NON-BLD 5MMX100MML (ENDOMECHANICALS) ×3 IMPLANT
WATER STERILE IRR 1000ML POUR (IV SOLUTION) ×3 IMPLANT
YANKAUER SUCT BULB TIP NO VENT (SUCTIONS) ×3 IMPLANT
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
## 2021-02-10 NOTE — Transfer of Care (Signed)
Immediate Anesthesia Transfer of Care Note  Patient: Michael Rosales  Procedure(s) Performed: OPEN VENTRAL HERNIA REPAIR WITH MESH (N/A Abdomen) LAPAROSCOPY DIAGNOSTIC (Abdomen)  Patient Location: PACU  Anesthesia Type:General  Level of Consciousness: awake, alert  and oriented  Airway & Oxygen Therapy: Patient Spontanous Breathing  Post-op Assessment: Report given to RN, Post -op Vital signs reviewed and stable and Patient moving all extremities  Post vital signs: Reviewed and stable  Last Vitals:  Vitals Value Taken Time  BP 149/79 02/10/21 1538  Temp    Pulse 64 02/10/21 1538  Resp 14 02/10/21 1538  SpO2 94 % 02/10/21 1538  Vitals shown include unvalidated device data.  Last Pain:  Vitals:   02/10/21 1017  TempSrc:   PainSc: 0-No pain         Complications: No complications documented.

## 2021-02-10 NOTE — Anesthesia Procedure Notes (Signed)
Procedure Name: Intubation Date/Time: 02/10/2021 2:11 PM Performed by: Rande Brunt, CRNA Pre-anesthesia Checklist: Patient identified, Emergency Drugs available, Suction available and Patient being monitored Patient Re-evaluated:Patient Re-evaluated prior to induction Oxygen Delivery Method: Circle System Utilized Preoxygenation: Pre-oxygenation with 100% oxygen Induction Type: IV induction Ventilation: Mask ventilation without difficulty Laryngoscope Size: 4 and Glidescope Grade View: Grade II Tube type: Oral Tube size: 7.5 mm Number of attempts: 1 Airway Equipment and Method: Stylet and Oral airway Placement Confirmation: ETT inserted through vocal cords under direct vision,  positive ETCO2 and breath sounds checked- equal and bilateral Secured at: 24 cm Tube secured with: Tape Dental Injury: Teeth and Oropharynx as per pre-operative assessment  Future Recommendations: Recommend- induction with short-acting agent, and alternative techniques readily available

## 2021-02-10 NOTE — Op Note (Signed)
   Operative Note   Date: 02/10/2021  Procedure: laparoscopic converted to open incisional hernia repair  Pre-op diagnosis: incarcerated incisional hernia Post-op diagnosis: same  Indication and clinical history: The patient is a 70 y.o. year old male with and incarcerated incisional hernia     Surgeon: Jesusita Oka, MD Assistant: Marlou Starks, MD  Anesthesiologist: Kerin Perna, MD Anesthesia: General  Findings:  Specimen: none EBL: 25cc Drains/Implants: 8cm circular ventralight permanent mesh  Disposition: PACU - hemodynamically stable.  Description of procedure: The patient was positioned supine on the operating room table. General anesthetic induction and intubation were uneventful. Time-out was performed verifying correct patient, procedure, signature of informed consent, and administration of pre-operative antibiotics, VTE prophylaxis with low molecular weight heparin. The abdomen was prepped and draped in the usual sterile fashion.  A Veress needle was used to enter the abdominal cavity in the left upper quadrant. The abdomen was insufflated and a 49mm port inserted using an optiview technique. The abdominal cavity was inspected, reavealing densely adherent bowel to the midline. The decision was made at this point to convert to an open procedure. A midline incision was made overlying the hernia and deepened down to the level of the fascia. The hernia was circumferentially isolated and the hernia contents reduced. Adhesiolysis was perfromed for approximately 30 minutes of the underside of the fascia using sharp dissection. An 8cm circular mesh was inserted and secured circumferentially with #1 novofil suture in an underlay fashion. The fascia was closed primarily over the mesh with #1 novofil figure-of-eight sutures. The wound was irrigated and the skin re-approximated with staples.   Sterile dressings were applied. All sponge and instrument counts were correct at the conclusion of the  procedure. The patient was awakened from anesthesia, extubated uneventfully, and transported to the PACU in good condition. There were no complications.    Jesusita Oka, MD General and [facility]

## 2021-02-10 NOTE — Anesthesia Postprocedure Evaluation (Signed)
Anesthesia Post Note  Patient: Michael Rosales  Procedure(s) Performed: OPEN VENTRAL HERNIA REPAIR WITH MESH (N/A Abdomen) LAPAROSCOPY DIAGNOSTIC (Abdomen)     Patient location during evaluation: PACU Anesthesia Type: General Level of consciousness: awake and alert Pain management: pain level controlled Vital Signs Assessment: post-procedure vital signs reviewed and stable Respiratory status: spontaneous breathing, nonlabored ventilation and respiratory function stable Cardiovascular status: blood pressure returned to baseline and stable Postop Assessment: no apparent nausea or vomiting Anesthetic complications: no   No complications documented.  Last Vitals:  Vitals:   02/10/21 1620 02/10/21 1649  BP: 133/76 136/78  Pulse: (!) 57 60  Resp: 16 16  Temp: 36.7 C 36.9 C  SpO2: 99% 97%    Last Pain:  Vitals:   02/10/21 1649  TempSrc: Oral  PainSc:                  Lidia Collum

## 2021-02-11 ENCOUNTER — Other Ambulatory Visit: Payer: Self-pay | Admitting: Surgery

## 2021-02-11 ENCOUNTER — Encounter (HOSPITAL_COMMUNITY): Payer: Self-pay | Admitting: Surgery

## 2021-02-11 DIAGNOSIS — F419 Anxiety disorder, unspecified: Secondary | ICD-10-CM | POA: Diagnosis present

## 2021-02-11 DIAGNOSIS — I10 Essential (primary) hypertension: Secondary | ICD-10-CM | POA: Diagnosis present

## 2021-02-11 DIAGNOSIS — F32A Depression, unspecified: Secondary | ICD-10-CM | POA: Diagnosis present

## 2021-02-11 DIAGNOSIS — F17210 Nicotine dependence, cigarettes, uncomplicated: Secondary | ICD-10-CM | POA: Diagnosis present

## 2021-02-11 DIAGNOSIS — K430 Incisional hernia with obstruction, without gangrene: Secondary | ICD-10-CM | POA: Diagnosis present

## 2021-02-11 DIAGNOSIS — Z8546 Personal history of malignant neoplasm of prostate: Secondary | ICD-10-CM | POA: Diagnosis not present

## 2021-02-11 DIAGNOSIS — M1990 Unspecified osteoarthritis, unspecified site: Secondary | ICD-10-CM | POA: Diagnosis present

## 2021-02-11 LAB — CBC
HCT: 33.2 % — ABNORMAL LOW (ref 39.0–52.0)
Hemoglobin: 10.3 g/dL — ABNORMAL LOW (ref 13.0–17.0)
MCH: 24.1 pg — ABNORMAL LOW (ref 26.0–34.0)
MCHC: 31 g/dL (ref 30.0–36.0)
MCV: 77.8 fL — ABNORMAL LOW (ref 80.0–100.0)
Platelets: 318 10*3/uL (ref 150–400)
RBC: 4.27 MIL/uL (ref 4.22–5.81)
RDW: 14.9 % (ref 11.5–15.5)
WBC: 14.2 10*3/uL — ABNORMAL HIGH (ref 4.0–10.5)
nRBC: 0 % (ref 0.0–0.2)

## 2021-02-11 MED ORDER — OXYCODONE HCL 5 MG PO TABS: 5.0000 mg | ORAL_TABLET | Freq: Four times a day (QID) | ORAL | 0 refills | Status: DC | PRN

## 2021-02-11 MED ORDER — DOCUSATE SODIUM 100 MG PO CAPS: 100.0000 mg | ORAL_CAPSULE | Freq: Two times a day (BID) | ORAL | 2 refills | Status: DC

## 2021-02-11 MED ORDER — IBUPROFEN 800 MG PO TABS: 800.0000 mg | ORAL_TABLET | Freq: Three times a day (TID) | ORAL | 1 refills | Status: DC | PRN

## 2021-02-11 NOTE — Discharge Instructions (Signed)
May shower beginning 02/12/2021. Do not peel off or scrub skin glue. May allow warm soapy water to run over incision, then rinse and pat dry. Do not soak in any water (tubs, hot tubs, pools, lakes, oceans) for one week.   No lifting greater than 5 pounds for six weeks.   Pain regimen: take over-the-counter tylenol (acetaminophen) 1000mg  every six hours and the prescription ibuprofen (800mg ) every eight hours. You also have a prescription for oxycodone, which should be taken if the tylenol (acetaminophen) and ibuprofen are not enough to control your pain. You may take the oxycodone as frequently as every six hours as needed. You have also been given a prescription for colace (docusate) which is a stool softener. Please take this as prescribed because the oxycodone can cause constipation and the colace (docusate) will minimize or prevent constipation.  Call the office at [phone] for temperature greater than 101.29F, worsening pain, redness or warmth at the incision site.  Please call [phone] to make an appointment for 1 week after surgery for wound check.

## 2021-02-11 NOTE — Care Management (Signed)
Patient requesting transportation home . Provided [facility] [employer] waiver to patient for him to review and sign. He is going to address in computer. Once he is ready to discharge. Nurse will call [employer].   Magdalen Spatz RN
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
## 2021-02-11 NOTE — Evaluation (Signed)
Physical Therapy Evaluation Patient Details Name: Michael Rosales MRN: [id number] DOB: 01/02/1951 Today's Date: 02/11/2021   History of Present Illness  Pt is a 70 y/o male s/p hernia repair on 3/15. PMH includes HTN, prostate cancer, and R TKA.  Clinical Impression  Pt is s/p surgery above with deficits below. Pt requiring min guard A for safety for mobility in the room. No overt LOB noted. Educated about use of RW to help with safety with mobility and mobility tolerance. Anticipate pt will progress well and will not require follow up PT. Reports the plan is to d/c today and pt reports he is ready to d/c. Reports he has all DME and has friends to assist as needed. Will continue to follow acutely should pt remain in hospital.     Follow Up Recommendations No PT follow up    Equipment Recommendations  None recommended by PT    Recommendations for Other Services       Precautions / Restrictions Precautions Precautions: Fall Restrictions Weight Bearing Restrictions: No      Mobility  Bed Mobility Overal bed mobility: Needs Assistance Bed Mobility: Supine to Sit     Supine to sit: Supervision     General bed mobility comments: Supervision for safety. Increased time required. Educated about use of log roll technique    Transfers Overall transfer level: Needs assistance Equipment used: None Transfers: Sit to/from Stand Sit to Stand: Min guard         General transfer comment: Min guard for safety. No physical assist required.  Ambulation/Gait Ambulation/Gait assistance: Min guard Gait Distance (Feet): 30 Feet Assistive device: 1 person hand held assist Gait Pattern/deviations: Step-through pattern;Decreased stride length;Wide base of support Gait velocity: Decreased   General Gait Details: Guarded gait secondary to pain, but no overt LOB noted. Min guard for safety. Educated to use RW at home for increased safety. Pt's breakfast in room, so mobility  limited  Stairs            Wheelchair Mobility    Modified Rankin (Stroke Patients Only)       Balance Overall balance assessment: Mild deficits observed, not formally tested                                           Pertinent Vitals/Pain Pain Assessment: Faces Faces Pain Scale: Hurts little more Pain Location: abdomen Pain Descriptors / Indicators: Aching;Operative site guarding Pain Intervention(s): Limited activity within patient's tolerance;Monitored during session;Repositioned    Home Living Family/patient expects to be discharged to:: Other (Comment) (boarding) Living Arrangements: Non-relatives/Friends Available Help at Discharge: Friend(s);Available 24 hours/day Type of Home: House Home Access: Stairs to enter Entrance Stairs-Rails: Right Entrance Stairs-Number of Steps: 3 Home Layout: One level Home Equipment: Walker - 2 wheels;Cane - single point      Prior Function Level of Independence: Independent               Hand Dominance        Extremity/Trunk Assessment   Upper Extremity Assessment Upper Extremity Assessment: Overall WFL for tasks assessed    Lower Extremity Assessment Lower Extremity Assessment: Overall WFL for tasks assessed    Cervical / Trunk Assessment Cervical / Trunk Assessment: Other exceptions Cervical / Trunk Exceptions: abdominal incision  Communication   Communication: No difficulties  Cognition Arousal/Alertness: Awake/alert Behavior During Therapy: WFL for tasks assessed/performed Overall Cognitive Status:  No family/caregiver present to determine baseline cognitive functioning                                        General Comments General comments (skin integrity, edema, etc.): Verbal education about how to use side step technique with stair navigation.    Exercises     Assessment/Plan    PT Assessment Patient needs continued PT services  PT Problem List Decreased  activity tolerance;Decreased mobility;Pain       PT Treatment Interventions DME instruction;Gait training;Therapeutic activities;Therapeutic exercise;Balance training;Stair training;Functional mobility training;Patient/family education    PT Goals (Current goals can be found in the Care Plan section)  Acute Rehab PT Goals Patient Stated Goal: to go home PT Goal Formulation: With patient Time For Goal Achievement: 02/25/21 Potential to Achieve Goals: Good    Frequency Min 3X/week   Barriers to discharge        Co-evaluation               AM-PAC PT "6 Clicks" Mobility  Outcome Measure Help needed turning from your back to your side while in a flat bed without using bedrails?: None Help needed moving from lying on your back to sitting on the side of a flat bed without using bedrails?: None Help needed moving to and from a bed to a chair (including a wheelchair)?: A Little Help needed standing up from a chair using your arms (e.g., wheelchair or bedside chair)?: A Little Help needed to walk in hospital room?: A Little Help needed climbing 3-5 steps with a railing? : A Little 6 Click Score: 20    End of Session   Activity Tolerance: Patient tolerated treatment well Patient left: in bed;with call bell/phone within reach;with nursing/sitter in room (sitting EOB) Nurse Communication: Mobility status PT Visit Diagnosis: Other abnormalities of gait and mobility (R26.89)    Time: 9292-4462 PT Time Calculation (min) (ACUTE ONLY): 11 min   Charges:   PT Evaluation $PT Eval Low Complexity: 1 Low          Lou Miner, DPT  Acute Rehabilitation Services  Pager: [phone] Office: [phone]   Rudean Hitt 02/11/2021, 12:20 PM

## 2021-02-11 NOTE — Progress Notes (Signed)
Pt seen and evaluated. Eating breakfast so wanted to limit mobility to around the room. Overall min guard for safety. No LOB noted. Pt reports he has all necessary DME. Educated about using RW at home for increased safety. Feel pt will progress well and will not require follow up PT. Reports he lives in a boarding home and has assist if needed. Formal note to follow.   Reuel Derby, PT, DPT  Acute Rehabilitation Services  Pager: [phone] Office: [phone]

## 2021-04-13 ENCOUNTER — Ambulatory Visit: Payer: No Typology Code available for payment source | Admitting: Podiatry

## 2021-04-16 ENCOUNTER — Ambulatory Visit (INDEPENDENT_AMBULATORY_CARE_PROVIDER_SITE_OTHER): Payer: Medicare Other

## 2021-04-16 ENCOUNTER — Encounter: Payer: Self-pay | Admitting: Podiatry

## 2021-04-16 ENCOUNTER — Other Ambulatory Visit: Payer: Self-pay

## 2021-04-16 ENCOUNTER — Ambulatory Visit (INDEPENDENT_AMBULATORY_CARE_PROVIDER_SITE_OTHER): Payer: Medicare Other | Admitting: Podiatry

## 2021-04-16 DIAGNOSIS — L309 Dermatitis, unspecified: Secondary | ICD-10-CM

## 2021-04-16 DIAGNOSIS — M778 Other enthesopathies, not elsewhere classified: Secondary | ICD-10-CM

## 2021-04-16 DIAGNOSIS — M79672 Pain in left foot: Secondary | ICD-10-CM | POA: Diagnosis not present

## 2021-04-16 DIAGNOSIS — M79671 Pain in right foot: Secondary | ICD-10-CM

## 2021-04-16 DIAGNOSIS — M2042 Other hammer toe(s) (acquired), left foot: Secondary | ICD-10-CM

## 2021-04-16 DIAGNOSIS — M2041 Other hammer toe(s) (acquired), right foot: Secondary | ICD-10-CM

## 2021-04-16 MED ORDER — TRIAMCINOLONE ACETONIDE 10 MG/ML IJ SUSP
10.0000 mg | Freq: Once | INTRAMUSCULAR | Status: AC
Start: 2021-04-16 — End: 2021-04-16
  Administered 2021-04-16: 10 mg

## 2021-04-16 NOTE — Progress Notes (Signed)
Subjective:   Patient ID: Michael Rosales, male   DOB: 69 y.o.   MRN: [id number]   HPI Patient presents with generalized stiffness of both feet blisters that he gets periodically in his heel region and inflammation pain between the fourth and fifth toes left foot with pain with shoe gear.  Patient does smoke half pack per day is not active   Review of Systems  All other systems reviewed and are negative.       Objective:  Physical Exam Vitals and nursing note reviewed.  Constitutional:      Appearance: He is well-developed.  Pulmonary:     Effort: Pulmonary effort is normal.  Musculoskeletal:        General: Normal range of motion.  Skin:    General: Skin is warm.  Neurological:     Mental Status: He is alert.     Vascular status mildly diminished but intact with patient noted to have slight diminishment sharp dull vibratory.  Dry skin noted bilateral with no active breakdown of the tissue and hammertoe deformity digits 2 through 5 both feet with moderate rigid contracture and inflammation of the fourth interspace left around the fourth MPJ with keratotic tissue formation.  Good digital perfusion noted     Assessment:  Digital deformities digits 2 through 5 both feet moderate rigid contracture causing stiffness along with inflammatory capsulitis fourth interspace left and around the fourth MPJ and dermatitis secondary to dry skin 8     Plan:  NP reviewed all conditions x-ray and at this point sterile prep and injected fourth MPJ 3 mg Dexasone Kenalog 5 mg Xylocaine advised on wider shoes and using lubricants for his heels.  Reappoint as symptoms indicate  X-rays indicate that there is moderate arthritis with digital deformities digits 2 through 5 of both feet

## 2021-06-29 ENCOUNTER — Other Ambulatory Visit (HOSPITAL_COMMUNITY): Payer: Self-pay | Admitting: Urology

## 2021-06-29 DIAGNOSIS — R9721 Rising PSA following treatment for malignant neoplasm of prostate: Secondary | ICD-10-CM

## 2021-07-15 ENCOUNTER — Other Ambulatory Visit: Payer: Self-pay

## 2021-07-15 ENCOUNTER — Ambulatory Visit (HOSPITAL_COMMUNITY)
Admission: RE | Admit: 2021-07-15 | Discharge: 2021-07-15 | Disposition: A | Discharge status: Home / Self Care | Payer: Medicare Other | Source: Ambulatory Visit | Attending: Urology | Admitting: Urology

## 2021-07-15 DIAGNOSIS — R9721 Rising PSA following treatment for malignant neoplasm of prostate: Secondary | ICD-10-CM | POA: Diagnosis not present

## 2021-07-15 MED ORDER — PIFLIFOLASTAT F 18 (PYLARIFY) INJECTION
9.0000 | Freq: Once | INTRAVENOUS | Status: AC
  Administered 2021-07-15: 9.4 via INTRAVENOUS

## 2021-08-31 ENCOUNTER — Encounter: Payer: Self-pay | Admitting: Internal Medicine

## 2021-09-25 ENCOUNTER — Other Ambulatory Visit (INDEPENDENT_AMBULATORY_CARE_PROVIDER_SITE_OTHER): Payer: Medicare Other

## 2021-09-25 ENCOUNTER — Ambulatory Visit (INDEPENDENT_AMBULATORY_CARE_PROVIDER_SITE_OTHER): Payer: Medicare Other | Admitting: Internal Medicine

## 2021-09-25 ENCOUNTER — Encounter: Payer: Self-pay | Admitting: Internal Medicine

## 2021-09-25 ENCOUNTER — Other Ambulatory Visit: Payer: Self-pay

## 2021-09-25 VITALS — BP 120/84 | HR 94 | Ht 74.0 in | Wt 176.4 lb

## 2021-09-25 DIAGNOSIS — D649 Anemia, unspecified: Secondary | ICD-10-CM

## 2021-09-25 DIAGNOSIS — Z9189 Other specified personal risk factors, not elsewhere classified: Secondary | ICD-10-CM

## 2021-09-25 DIAGNOSIS — R634 Abnormal weight loss: Secondary | ICD-10-CM

## 2021-09-25 DIAGNOSIS — K921 Melena: Secondary | ICD-10-CM

## 2021-09-25 DIAGNOSIS — Z1211 Encounter for screening for malignant neoplasm of colon: Secondary | ICD-10-CM

## 2021-09-25 DIAGNOSIS — D508 Other iron deficiency anemias: Secondary | ICD-10-CM

## 2021-09-25 LAB — CBC WITH DIFFERENTIAL/PLATELET
Basophils Absolute: 0.1 10*3/uL (ref 0.0–0.1)
Basophils Relative: 1.9 % (ref 0.0–3.0)
Eosinophils Absolute: 0.1 10*3/uL (ref 0.0–0.7)
Eosinophils Relative: 1.1 % (ref 0.0–5.0)
HCT: 34.9 % — ABNORMAL LOW (ref 39.0–52.0)
Hemoglobin: 10.5 g/dL — ABNORMAL LOW (ref 13.0–17.0)
Lymphocytes Relative: 28.7 % (ref 12.0–46.0)
Lymphs Abs: 2 10*3/uL (ref 0.7–4.0)
MCHC: 30.2 g/dL (ref 30.0–36.0)
MCV: 71.3 fl — ABNORMAL LOW (ref 78.0–100.0)
Monocytes Absolute: 1.1 10*3/uL — ABNORMAL HIGH (ref 0.1–1.0)
Monocytes Relative: 15 % — ABNORMAL HIGH (ref 3.0–12.0)
Neutro Abs: 3.8 10*3/uL (ref 1.4–7.7)
Neutrophils Relative %: 53.3 % (ref 43.0–77.0)
Platelets: 344 10*3/uL (ref 150.0–400.0)
RBC: 4.89 Mil/uL (ref 4.22–5.81)
RDW: 22.8 % — ABNORMAL HIGH (ref 11.5–15.5)
WBC: 7.1 10*3/uL (ref 4.0–10.5)

## 2021-09-25 LAB — FERRITIN: Ferritin: 10.1 ng/mL — ABNORMAL LOW (ref 22.0–322.0)

## 2021-09-25 MED ORDER — FERROUS SULFATE 324 (65 FE) MG PO TBEC: 1.0000 | DELAYED_RELEASE_TABLET | Freq: Every day | ORAL | 2 refills | Status: AC

## 2021-09-25 MED ORDER — NA SULFATE-K SULFATE-MG SULF 17.5-3.13-1.6 GM/177ML PO SOLN: 1.0000 | Freq: Once | ORAL | 0 refills | Status: DC
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
## 2021-09-25 NOTE — Patient Instructions (Addendum)
If you are age 68 or older, your body mass index should be between 23-30. Your Body mass index is 22.65 kg/m. If this is out of the aforementioned range listed, please consider follow up with your Primary Care Provider.  If you are age 28 or younger, your body mass index should be between 19-25. Your Body mass index is 22.65 kg/m. If this is out of the aformentioned range listed, please consider follow up with your Primary Care Provider.   Due to recent changes in healthcare laws, you may see the results of your imaging and laboratory studies on MyChart before your provider has had a chance to review them.  We understand that in some cases there may be results that are confusing or concerning to you. Not all laboratory results come back in the same time frame and the provider may be waiting for multiple results in order to interpret others.  Please give [place] 48 hours in order for your provider to thoroughly review all the results before contacting the office for clarification of your results.   We have sent the following medications to your pharmacy for you to pick up at your convenience:  Pick up your colonoscopy prep at your pharmacy  Your provider has requested that you go to the basement level for lab work before leaving today. Press "B" on the elevator. The lab is located at the first door on the left as you exit the elevator.  You are scheduled for a colonoscopy and EGD at [facility] on 11/05/2021. We will send all of your instructions to your home address.  Thank you for trusting me with your gastrointestinal care!    Dr Lorenso Courier

## 2021-09-26 LAB — IRON, TOTAL/TOTAL IRON BINDING CAP
%SAT: 6 % (calc) — ABNORMAL LOW (ref 20–48)
Iron: 28 ug/dL — ABNORMAL LOW (ref 50–180)
TIBC: 470 mcg/dL (calc) — ABNORMAL HIGH (ref 250–425)

## 2021-10-26 ENCOUNTER — Other Ambulatory Visit: Payer: Self-pay | Admitting: General Surgery

## 2021-10-26 ENCOUNTER — Encounter (HOSPITAL_COMMUNITY): Payer: Self-pay | Admitting: Internal Medicine

## 2021-10-26 MED ORDER — NA SULFATE-K SULFATE-MG SULF 17.5-3.13-1.6 GM/177ML PO SOLN: 1.0000 | Freq: Once | ORAL | 0 refills | Status: AC

## 2021-11-05 ENCOUNTER — Ambulatory Visit (HOSPITAL_COMMUNITY): Payer: Medicare Other | Admitting: Certified Registered Nurse Anesthetist

## 2021-11-05 ENCOUNTER — Encounter (HOSPITAL_COMMUNITY)
Admission: RE | Disposition: A | Discharge status: Home / Self Care | Payer: Self-pay | Source: Ambulatory Visit | Attending: Internal Medicine

## 2021-11-05 ENCOUNTER — Ambulatory Visit (HOSPITAL_COMMUNITY)
Admission: RE | Admit: 2021-11-05 | Discharge: 2021-11-05 | Disposition: A | Discharge status: Home / Self Care | Payer: Medicare Other | Source: Ambulatory Visit | Attending: Internal Medicine | Admitting: Internal Medicine

## 2021-11-05 ENCOUNTER — Other Ambulatory Visit: Payer: Self-pay

## 2021-11-05 ENCOUNTER — Encounter (HOSPITAL_COMMUNITY): Payer: Self-pay | Admitting: Internal Medicine

## 2021-11-05 DIAGNOSIS — K2950 Unspecified chronic gastritis without bleeding: Secondary | ICD-10-CM | POA: Diagnosis not present

## 2021-11-05 DIAGNOSIS — K449 Diaphragmatic hernia without obstruction or gangrene: Secondary | ICD-10-CM | POA: Insufficient documentation

## 2021-11-05 DIAGNOSIS — F17200 Nicotine dependence, unspecified, uncomplicated: Secondary | ICD-10-CM | POA: Diagnosis not present

## 2021-11-05 DIAGNOSIS — D509 Iron deficiency anemia, unspecified: Secondary | ICD-10-CM | POA: Diagnosis present

## 2021-11-05 DIAGNOSIS — K3189 Other diseases of stomach and duodenum: Secondary | ICD-10-CM | POA: Insufficient documentation

## 2021-11-05 DIAGNOSIS — R634 Abnormal weight loss: Secondary | ICD-10-CM

## 2021-11-05 DIAGNOSIS — K648 Other hemorrhoids: Secondary | ICD-10-CM | POA: Insufficient documentation

## 2021-11-05 DIAGNOSIS — K2970 Gastritis, unspecified, without bleeding: Secondary | ICD-10-CM

## 2021-11-05 DIAGNOSIS — K2980 Duodenitis without bleeding: Secondary | ICD-10-CM | POA: Diagnosis not present

## 2021-11-05 DIAGNOSIS — D124 Benign neoplasm of descending colon: Secondary | ICD-10-CM | POA: Insufficient documentation

## 2021-11-05 DIAGNOSIS — Z1211 Encounter for screening for malignant neoplasm of colon: Secondary | ICD-10-CM | POA: Insufficient documentation

## 2021-11-05 DIAGNOSIS — D649 Anemia, unspecified: Secondary | ICD-10-CM

## 2021-11-05 DIAGNOSIS — I10 Essential (primary) hypertension: Secondary | ICD-10-CM | POA: Insufficient documentation

## 2021-11-05 DIAGNOSIS — K219 Gastro-esophageal reflux disease without esophagitis: Secondary | ICD-10-CM | POA: Diagnosis not present

## 2021-11-05 HISTORY — PX: ESOPHAGOGASTRODUODENOSCOPY (EGD) WITH PROPOFOL: SHX5813

## 2021-11-05 HISTORY — PX: BIOPSY: SHX5522

## 2021-11-05 HISTORY — PX: POLYPECTOMY: SHX5525

## 2021-11-05 HISTORY — PX: COLONOSCOPY WITH PROPOFOL: SHX5780

## 2021-11-05 SURGERY — COLONOSCOPY WITH PROPOFOL
Anesthesia: Monitor Anesthesia Care

## 2021-11-05 MED ORDER — EPHEDRINE SULFATE-NACL 50-0.9 MG/10ML-% IV SOSY
PREFILLED_SYRINGE | INTRAVENOUS | Status: DC | PRN
  Administered 2021-11-05: 5 mg via INTRAVENOUS

## 2021-11-05 MED ORDER — PROPOFOL 1000 MG/100ML IV EMUL
INTRAVENOUS | Status: AC
  Filled 2021-11-05: qty 100

## 2021-11-05 MED ORDER — LACTATED RINGERS IV SOLN: INTRAVENOUS | Status: DC

## 2021-11-05 MED ORDER — OMEPRAZOLE 40 MG PO CPDR: 40.0000 mg | DELAYED_RELEASE_CAPSULE | Freq: Two times a day (BID) | ORAL | 0 refills | Status: DC

## 2021-11-05 MED ORDER — SODIUM CHLORIDE 0.9 % IV SOLN: INTRAVENOUS | Status: DC

## 2021-11-05 MED ORDER — PROPOFOL 500 MG/50ML IV EMUL
INTRAVENOUS | Status: DC | PRN
  Administered 2021-11-05: 125 ug/kg/min via INTRAVENOUS

## 2021-11-05 MED ORDER — PROPOFOL 500 MG/50ML IV EMUL
INTRAVENOUS | Status: DC | PRN
  Administered 2021-11-05 (×3): 20 mg via INTRAVENOUS
  Administered 2021-11-05 (×2): 10 mg via INTRAVENOUS
  Administered 2021-11-05: 30 mg via INTRAVENOUS
  Administered 2021-11-05: 20 mg via INTRAVENOUS
  Administered 2021-11-05: 10 mg via INTRAVENOUS

## 2021-11-05 SURGICAL SUPPLY — 24 items

## 2021-11-05 NOTE — Anesthesia Postprocedure Evaluation (Signed)
Anesthesia Post Note  Patient: Michael Rosales  Procedure(s) Performed: COLONOSCOPY WITH PROPOFOL ESOPHAGOGASTRODUODENOSCOPY (EGD) WITH PROPOFOL BIOPSY POLYPECTOMY     Patient location during evaluation: PACU Anesthesia Type: MAC Level of consciousness: awake and alert Pain management: pain level controlled Vital Signs Assessment: post-procedure vital signs reviewed and stable Respiratory status: spontaneous breathing, nonlabored ventilation, respiratory function stable and patient connected to nasal cannula oxygen Cardiovascular status: stable and blood pressure returned to baseline Postop Assessment: no apparent nausea or vomiting Anesthetic complications: no   No notable events documented.  Last Vitals:  Vitals:   11/05/21 1337 11/05/21 1341  BP: 104/62 109/71  Pulse: 64   Resp: (!) 21 16  Temp:    SpO2:      Last Pain:  Vitals:   11/05/21 1328  TempSrc: Axillary  PainSc:                  Jehan Bonano S
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
## 2021-11-05 NOTE — Transfer of Care (Signed)
Immediate Anesthesia Transfer of Care Note  Patient: Michael Rosales  Procedure(s) Performed: COLONOSCOPY WITH PROPOFOL ESOPHAGOGASTRODUODENOSCOPY (EGD) WITH PROPOFOL BIOPSY POLYPECTOMY  Patient Location: Endoscopy Unit  Anesthesia Type:MAC  Level of Consciousness: drowsy and patient cooperative  Airway & Oxygen Therapy: Patient Spontanous Breathing and Patient connected to face mask oxygen  Post-op Assessment: Report given to RN and Post -op Vital signs reviewed and stable  Post vital signs: Reviewed and stable  Last Vitals:  Vitals Value Taken Time  BP    Temp    Pulse 67 11/05/21 1327  Resp 19 11/05/21 1327  SpO2 100 % 11/05/21 1327  Vitals shown include unvalidated device data.  Last Pain:  Vitals:   11/05/21 1053  TempSrc: Oral  PainSc: 0-No pain         Complications: No notable events documented.

## 2021-11-05 NOTE — Anesthesia Preprocedure Evaluation (Addendum)
Anesthesia Evaluation  Patient identified by MRN, date of birth, ID band Patient awake    Reviewed: Allergy & Precautions, NPO status , Patient's Chart, lab work & pertinent test results  Airway Mallampati: I  TM Distance: >3 FB Neck ROM: Full    Dental  (+) Edentulous Upper, Missing,    Pulmonary Current Smoker,    breath sounds clear to auscultation       Cardiovascular hypertension,  Rhythm:Regular Rate:Normal     Neuro/Psych PSYCHIATRIC DISORDERS Anxiety Depression    GI/Hepatic Neg liver ROS, GERD  Medicated,  Endo/Other  negative endocrine ROS  Renal/GU negative Renal ROS     Musculoskeletal  (+) Arthritis ,   Abdominal Normal abdominal exam  (+)   Peds  Hematology negative hematology ROS (+)   Anesthesia Other Findings   Reproductive/Obstetrics                            Anesthesia Physical Anesthesia Plan  ASA: 2  Anesthesia Plan: MAC   Post-op Pain Management:    Induction: Intravenous  PONV Risk Score and Plan: 0 and Propofol infusion  Airway Management Planned: Natural Airway and Simple Face Mask  Additional Equipment: None  Intra-op Plan:   Post-operative Plan:   Informed Consent: I have reviewed the patients History and Physical, chart, labs and discussed the procedure including the risks, benefits and alternatives for the proposed anesthesia with the patient or authorized representative who has indicated his/her understanding and acceptance.     Dental advisory given  Plan Discussed with: CRNA  Anesthesia Plan Comments:        Anesthesia Quick Evaluation

## 2021-11-05 NOTE — Op Note (Signed)
[facility] Patient Name: Michael Rosales Procedure Date: 11/05/2021 MRN: [id number] Attending MD: Georgian Co ,  Date of Birth: [birth year] CSN: [id number] Age: 70 Admit Type: Outpatient Procedure:                Colonoscopy Indications:              Screening for colorectal malignant neoplasm Providers:                Adline Mango" Berna Bue, RN, Benetta Spar, Technician Referring MD:              Medicines:                Monitored Anesthesia Care Complications:            No immediate complications. Estimated Blood Loss:     Estimated blood loss was minimal. Procedure:                Pre-Anesthesia Assessment:                           - Prior to the procedure, a History and Physical                            was performed, and patient medications and                            allergies were reviewed. The patient's tolerance of                            previous anesthesia was also reviewed. The risks                            and benefits of the procedure and the sedation                            options and risks were discussed with the patient.                            All questions were answered, and informed consent                            was obtained. Prior Anticoagulants: The patient has                            taken no previous anticoagulant or antiplatelet                            agents. ASA Grade Assessment: III - A patient with                            severe systemic disease. After reviewing the risks  and benefits, the patient was deemed in                            satisfactory condition to undergo the procedure.                           After obtaining informed consent, the colonoscope                            was passed under direct vision. Throughout the                            procedure, the patient's blood pressure, pulse, and                             oxygen saturations were monitored continuously. The                            CF-HQ190L ([id number]) Olympus colonoscope was                            introduced through the anus and advanced to the the                            terminal ileum. The colonoscopy was performed                            without difficulty. The patient tolerated the                            procedure well. The quality of the bowel                            preparation was adequate. The terminal ileum,                            ileocecal valve, appendiceal orifice, and rectum                            were photographed. Scope In: 12:54:14 PM Scope Out: 1:21:39 PM Scope Withdrawal Time: 0 hours 19 minutes 34 seconds  Total Procedure Duration: 0 hours 27 minutes 25 seconds  Findings:      The terminal ileum appeared normal.      Five sessile polyps were found in the sigmoid colon and descending       colon. The polyps were 2 to 8 mm in size. These polyps were removed with       a cold snare. Resection and retrieval were complete.      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 Impression:               - The examined portion of the ileum was normal.                           -  Five 2 to 8 mm polyps in the sigmoid colon and in                            the descending colon, removed with a cold snare.                            Resected and retrieved.                           - Increased mucosa vascular pattern in the rectum.                           - Non-bleeding internal hemorrhoids. Moderate Sedation:      Not Applicable - Patient had care per Anesthesia. Recommendation:           - Discharge patient to home (with escort).                           - The patient's IDA may be due to gastric erosions.                            Recommend discontinuation of  NSAID use if possible.                            No obvious source of weight loss was identified.                            Recommend continued evaluation of the patient's                            weight loss with his primary care physician.                           - Await pathology results.                           - Continue oral iron supplementation.                           - The findings and recommendations were discussed                            with the patient. Procedure Code(s):        --- Professional ---                           [phone], Colonoscopy, flexible; with removal of                            tumor(s), polyp(s), or other lesion(s) by snare                            technique Diagnosis Code(s):        --- Professional ---  Z12.11, Encounter for screening for malignant                            neoplasm of colon                           K64.8, Other hemorrhoids                           K63.5, Polyp of colon CPT copyright 2019 American Medical Association. All rights reserved. The codes documented in this report are preliminary and upon coder review may  be revised to meet current compliance requirements. Sonny Masters "Christia Reading,  11/05/2021 1:33:40 PM Number of Addenda: 0

## 2021-11-05 NOTE — Op Note (Signed)
[facility] Patient Name: Michael Rosales Procedure Date: 11/05/2021 MRN: [id number] Attending MD: Georgian Co ,  Date of Birth: [birth year] CSN: [id number] Age: 70 Admit Type: Outpatient Procedure:                Upper GI endoscopy Indications:              Iron deficiency anemia Providers:                Shelly Coss, RN, Benetta Spar, Technician Referring MD:              Medicines:                Monitored Anesthesia Care Complications:            No immediate complications. Estimated Blood Loss:     Estimated blood loss was minimal. Procedure:                Pre-Anesthesia Assessment:                           - Prior to the procedure, a History and Physical                            was performed, and patient medications and                            allergies were reviewed. The patient's tolerance of                            previous anesthesia was also reviewed. The risks                            and benefits of the procedure and the sedation                            options and risks were discussed with the patient.                            All questions were answered, and informed consent                            was obtained. Prior Anticoagulants: The patient has                            taken no previous anticoagulant or antiplatelet                            agents. ASA Grade Assessment: III - A patient with                            severe systemic disease. After reviewing the risks  and benefits, the patient was deemed in                            satisfactory condition to undergo the procedure.                           After obtaining informed consent, the endoscope was                            passed under direct vision. Throughout the                            procedure, the patient's blood pressure, pulse, and                            oxygen  saturations were monitored continuously. The                            GIF-H190 ([id number]) Olympus endoscope was introduced                            through the mouth, and advanced to the third part                            of duodenum. The upper GI endoscopy was                            accomplished without difficulty. The patient                            tolerated the procedure well. Scope In: Scope Out: Findings:      The examined esophagus was normal.      A hiatal hernia was present.      Localized inflammation characterized by congestion (edema), erosions and       erythema was found in the gastric body and in the gastric antrum.       Biopsies were taken with a cold forceps for histology.      Localized erythematous mucosa without active bleeding and with no       stigmata of bleeding was found in the duodenal bulb. Biopsies were taken       with a cold forceps for histology. Impression:               - Normal esophagus.                           - Hiatal hernia.                           - Gastritis. Biopsied.                           - Erythematous duodenopathy. Biopsied. Moderate Sedation:      Not Applicable - Patient had care per Anesthesia. Recommendation:           - Use a proton pump inhibitor PO BID for 8 weeks.                           -  Avoid NSAIDs.                           - Await pathology results.                           - Perform a colonoscopy today. Procedure Code(s):        --- Professional ---                           [phone], Esophagogastroduodenoscopy, flexible,                            transoral; with biopsy, single or multiple Diagnosis Code(s):        --- Professional ---                           K44.9, Diaphragmatic hernia without obstruction or                            gangrene                           K29.70, Gastritis, unspecified, without bleeding                           K31.89, Other diseases of stomach and duodenum                            D50.9, Iron deficiency anemia, unspecified CPT copyright 2019 [employer]. All rights reserved. The codes documented in this report are preliminary and upon coder review may  be revised to meet current compliance requirements. Michael Rosales "Christia Reading,  11/05/2021 1:23:46 PM Number of Addenda: 0

## 2021-11-06 ENCOUNTER — Encounter (HOSPITAL_COMMUNITY): Payer: Self-pay | Admitting: Internal Medicine

## 2021-11-09 LAB — SURGICAL PATHOLOGY

## 2021-11-12 ENCOUNTER — Encounter: Payer: Self-pay | Admitting: Internal Medicine

## 2022-01-12 IMAGING — DX DG CHEST 1V PORT
1 series · 1 of 1 positions shown · non-contrast
Comparison: December 29, 2018

CLINICAL DATA: Status post trauma.

EXAM:
PORTABLE CHEST 1 VIEW

[chest]
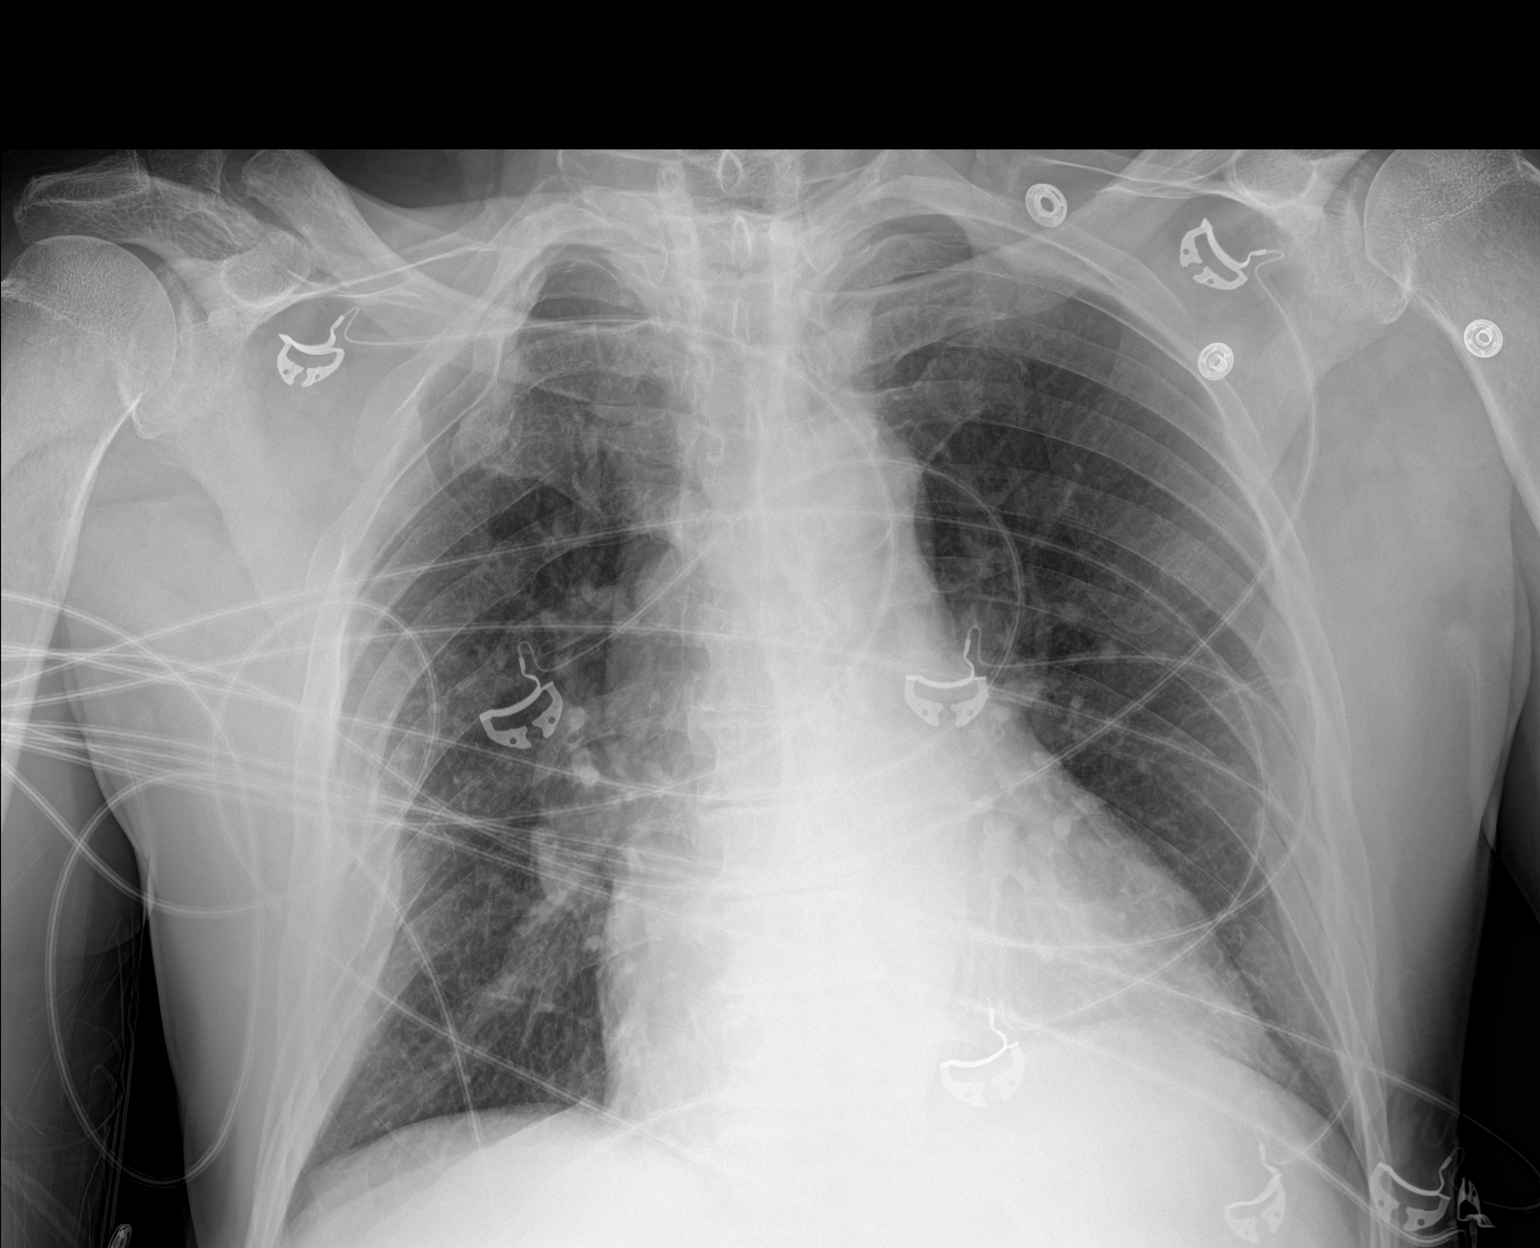

[1 of 1 positions shown; findings below may reference images not displayed]

FINDINGS: The heart size and mediastinal contours are within normal limits.
Both lungs are clear. Multiple chronic bilateral rib fractures are
seen. A chronic deformity of the distal right clavicle is also
noted.
IMPRESSION: No acute cardiopulmonary disease.

## 2022-01-12 IMAGING — DX DG PELVIS 1-2V
1 series · 1 of 1 positions shown · non-contrast
Comparison: January 01, 2018

CLINICAL DATA: Status post trauma.

EXAM:
PELVIS - 1-2 VIEW

[pelvis]
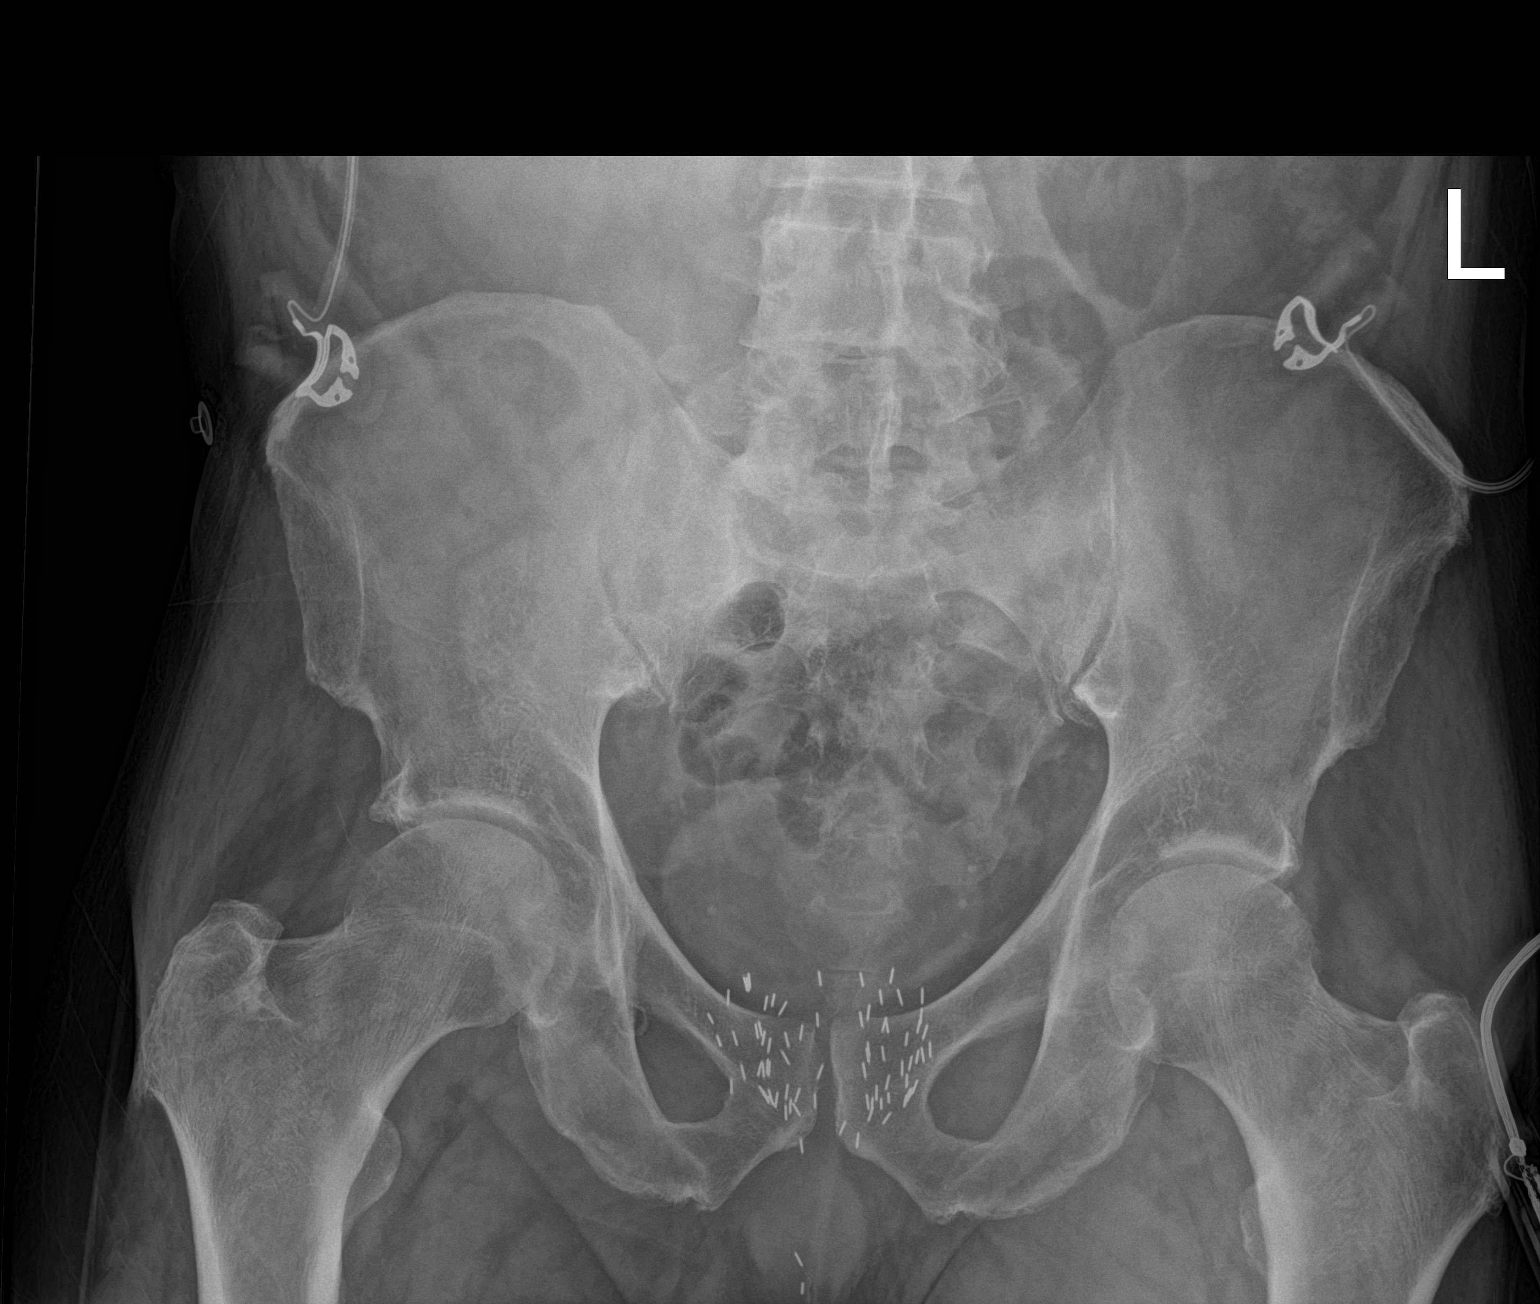

[1 of 1 positions shown; findings below may reference images not displayed]

FINDINGS: There is no evidence of pelvic fracture or diastasis. No pelvic bone
lesions are seen. Radiopaque prostate radiation implantation seeds
are seen.
IMPRESSION: Negative.

## 2022-01-29 ENCOUNTER — Other Ambulatory Visit: Payer: Self-pay | Admitting: Internal Medicine

## 2022-02-02 ENCOUNTER — Other Ambulatory Visit: Payer: Self-pay | Admitting: Internal Medicine

## 2022-02-03 ENCOUNTER — Other Ambulatory Visit: Payer: Self-pay

## 2022-02-03 MED ORDER — OMEPRAZOLE 40 MG PO CPDR: 40.0000 mg | DELAYED_RELEASE_CAPSULE | Freq: Two times a day (BID) | ORAL | 0 refills | Status: DC

## 2022-02-04 ENCOUNTER — Other Ambulatory Visit: Payer: Self-pay | Admitting: Internal Medicine

## 2022-02-07 ENCOUNTER — Other Ambulatory Visit: Payer: Self-pay | Admitting: Internal Medicine

## 2022-08-03 ENCOUNTER — Other Ambulatory Visit: Payer: Self-pay | Admitting: Internal Medicine

## 2022-09-30 ENCOUNTER — Other Ambulatory Visit: Payer: Self-pay | Admitting: Nurse Practitioner

## 2022-09-30 DIAGNOSIS — Z122 Encounter for screening for malignant neoplasm of respiratory organs: Secondary | ICD-10-CM

## 2022-09-30 DIAGNOSIS — F17200 Nicotine dependence, unspecified, uncomplicated: Secondary | ICD-10-CM

## 2022-11-10 ENCOUNTER — Ambulatory Visit
Admission: RE | Admit: 2022-11-10 | Discharge: 2022-11-10 | Disposition: A | Discharge status: Home / Self Care | Payer: Medicare Other | Source: Ambulatory Visit | Attending: Nurse Practitioner | Admitting: Nurse Practitioner

## 2022-11-10 DIAGNOSIS — F17200 Nicotine dependence, unspecified, uncomplicated: Secondary | ICD-10-CM

## 2022-11-10 DIAGNOSIS — Z122 Encounter for screening for malignant neoplasm of respiratory organs: Secondary | ICD-10-CM

## 2022-11-28 IMAGING — PT NM PET TUM IMG SKULL BASE T - THIGH
7 series · 25 of 25 positions shown · non-contrast
Comparison: None

CLINICAL DATA: Prostate carcinoma with biochemical recurrence.
Brachytherapy implants 01/26/2019. PSA 4.4.

EXAM:
NUCLEAR MEDICINE PET SKULL BASE TO THIGH
TECHNIQUE: 9.4 mCi F18 Piflufolastat (Pylarify) was injected intravenously.
Full-ring PET imaging was performed from the skull base to thigh
after the radiotracer. CT data was obtained and used for attenuation
correction and anatomic localization.

[Series 3: pet sk_thigh ac · axial · 5.0mm · 4.07mm/px · z∈[-1044,-44]mm · 6 of 251 slices shown]
[im 1/251]
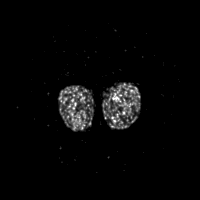
[im 51/251]
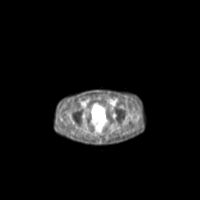
[im 101/251]
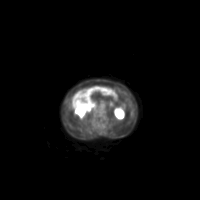
[im 151/251]
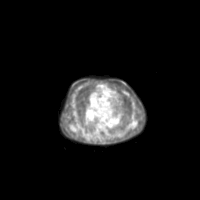
[im 201/251]
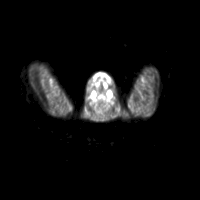
[im 251/251]
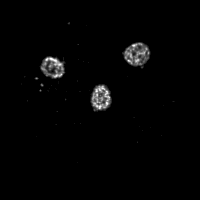

[Series 4: ct sk_thigh 5.0 bf37 · axial · 5.0mm · 0.93mm/px · z∈[-1044,-44]mm · 5 of 251 slices shown]
[im 1/251]
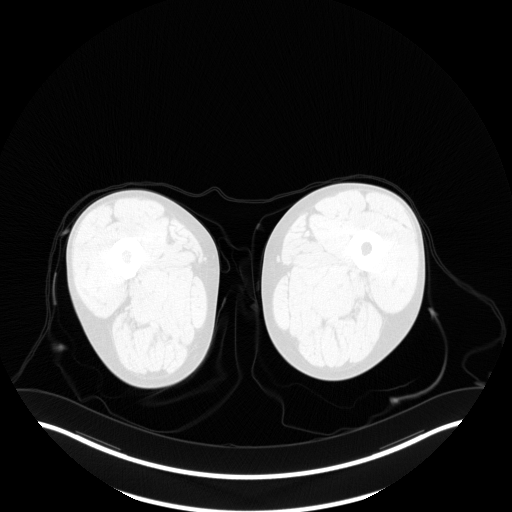
[im 63/251]
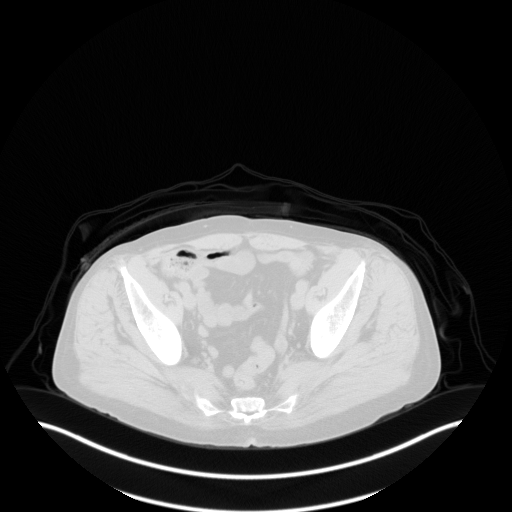
[im 126/251]
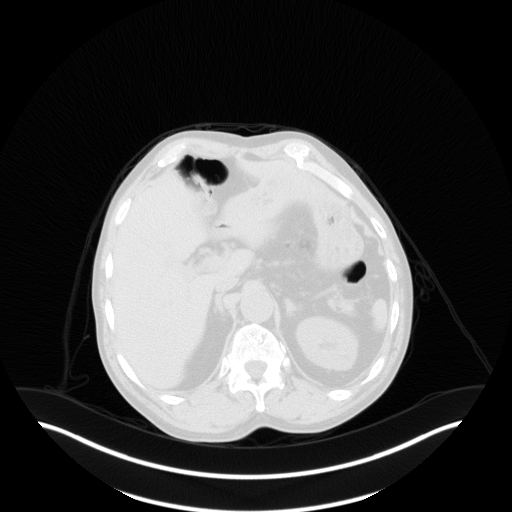
[im 188/251]
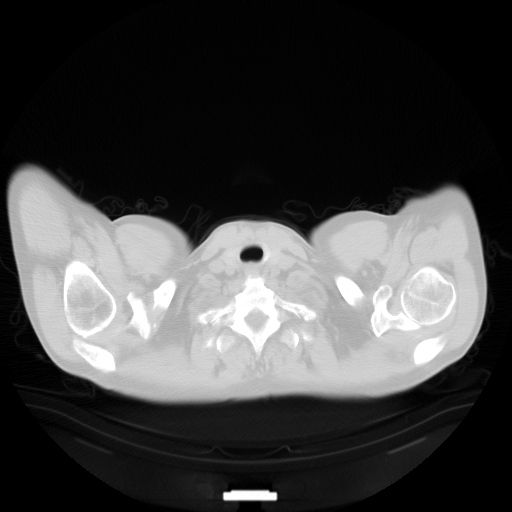
[im 251/251]
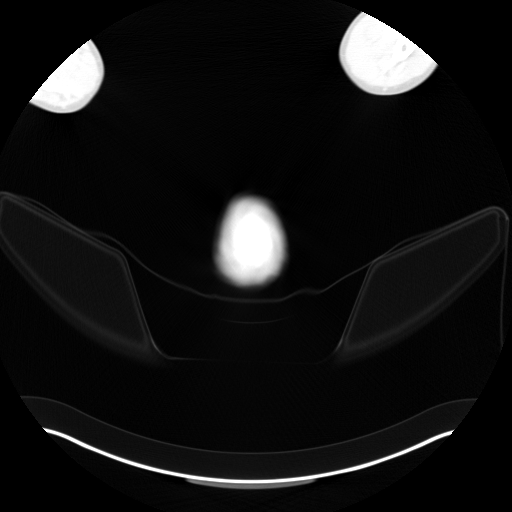

[Series 5: pet sk_thigh nac · axial · 5.0mm · 4.07mm/px · z∈[-1044,-44]mm · 5 of 251 slices shown]
[im 1/251]
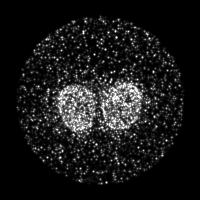
[im 63/251]
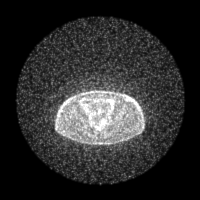
[im 126/251]
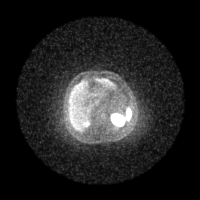
[im 188/251]
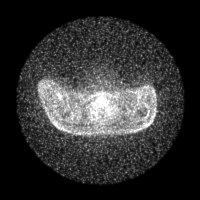
[im 251/251]
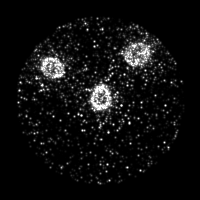

[Series 8: ct sk_thigh 5.0 1 br59 lung_bone · axial · 5.0mm · 0.64mm/px · 1 of 70 slices shown]
[im 1/70]
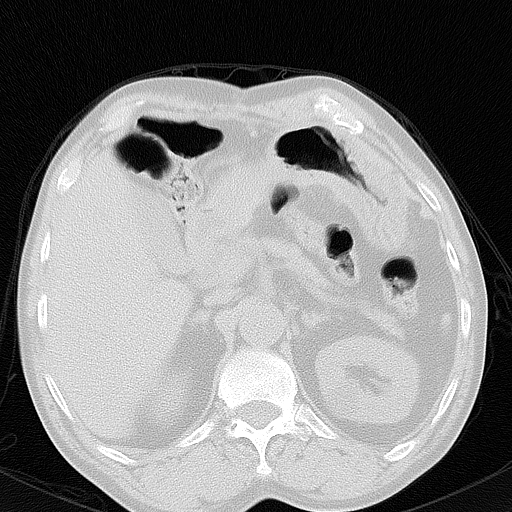

[Series 603: fused cor · 2 of 80 slices shown]
[im 1/80]
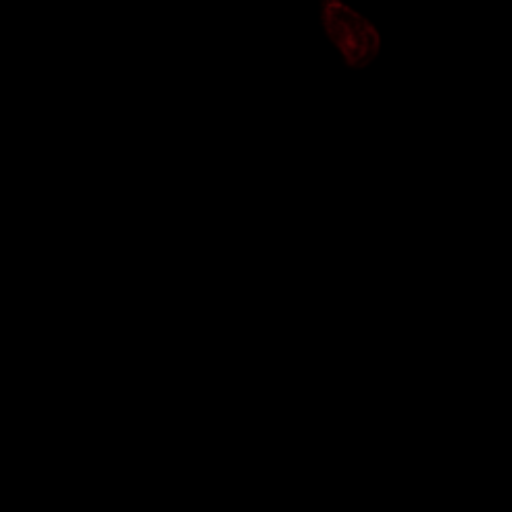
[im 80/80]
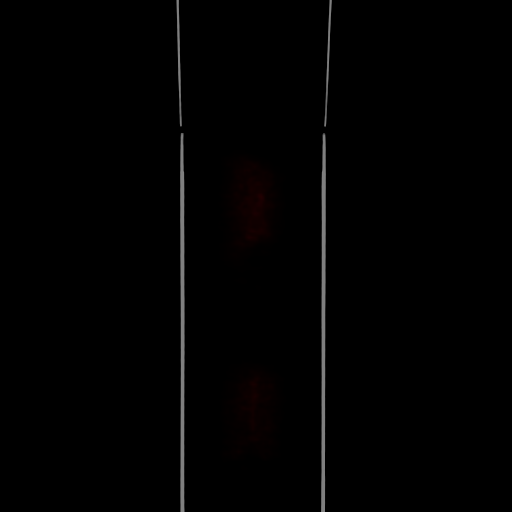

[Series 604: <mip collection> · coronal · 2.07mm/px · 1 of 32 slices shown]
[im 1/32]
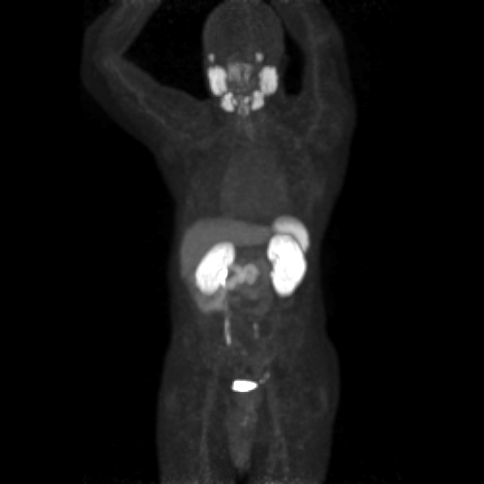

[Series 605: range-ct sk_thigh 5.0 bf37-tra-<alpha range> · 5 of 244 slices shown]
[im 1/244]
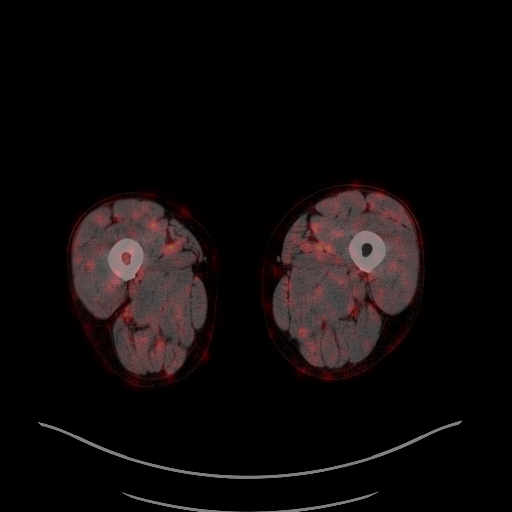
[im 61/244]
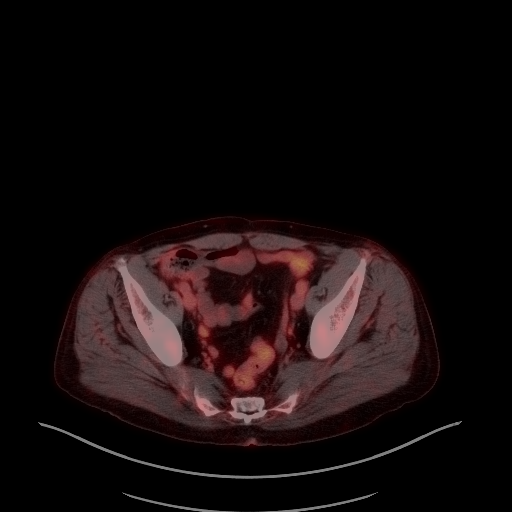
[im 122/244]
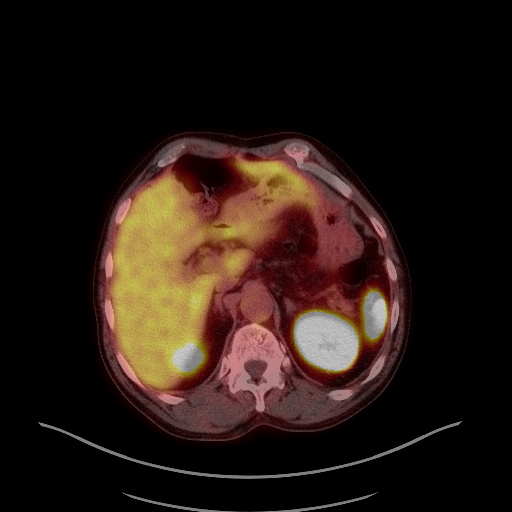
[im 183/244]
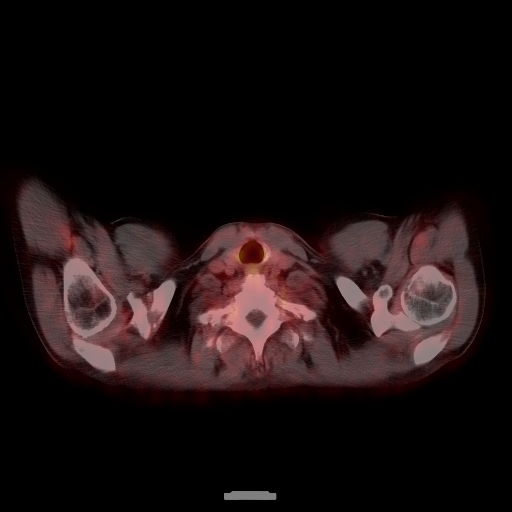
[im 244/244]
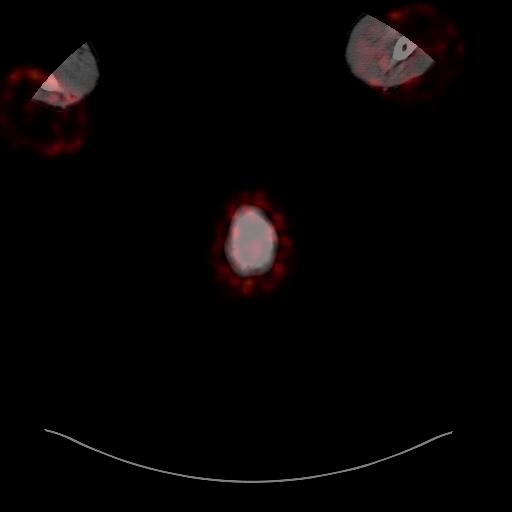

[25 of 25 positions shown; findings below may reference images not displayed]

FINDINGS: NECK

No radiotracer activity in neck lymph nodes.

Incidental CT finding: None

CHEST

No radiotracer accumulation within mediastinal or hilar lymph nodes.
No suspicious pulmonary nodules on the CT scan.

Incidental CT finding: None

ABDOMEN/PELVIS

Prostate: Brachytherapy seeds within the prostate gland. No abnormal
focal activity identified within the gland.

Lymph nodes: No abnormal radiotracer accumulation within pelvic or
abdominal nodes.

Liver: No evidence of liver metastasis

Incidental CT finding: None

SKELETON

No focal activity to suggest skeletal metastasis. Multiple healed
RIGHT rib fractures.
IMPRESSION: 1. No evidence of local recurrence in the prostate gland.
2. No evidence of metastatic lymph nodes in the pelvis or periaortic
retroperitoneum.
3. No evidence of visceral metastasis or skeletal metastasis

## 2023-05-05 ENCOUNTER — Other Ambulatory Visit: Payer: Self-pay | Admitting: Physician Assistant

## 2023-05-05 DIAGNOSIS — H9120 Sudden idiopathic hearing loss, unspecified ear: Secondary | ICD-10-CM

## 2023-05-05 DIAGNOSIS — H903 Sensorineural hearing loss, bilateral: Secondary | ICD-10-CM

## 2023-06-05 ENCOUNTER — Other Ambulatory Visit: Payer: Self-pay | Admitting: Internal Medicine

## 2023-06-22 ENCOUNTER — Ambulatory Visit
Admission: RE | Admit: 2023-06-22 | Discharge: 2023-06-22 | Disposition: A | Discharge status: Home / Self Care | Payer: 59 | Source: Ambulatory Visit | Attending: Physician Assistant | Admitting: Physician Assistant

## 2023-06-22 DIAGNOSIS — H903 Sensorineural hearing loss, bilateral: Secondary | ICD-10-CM

## 2023-06-22 DIAGNOSIS — H9120 Sudden idiopathic hearing loss, unspecified ear: Secondary | ICD-10-CM

## 2023-06-22 MED ORDER — GADOPICLENOL 0.5 MMOL/ML IV SOLN
7.5000 mL | Freq: Once | INTRAVENOUS | Status: AC | PRN
  Administered 2023-06-22: 7.5 mL via INTRAVENOUS

## 2023-09-04 ENCOUNTER — Other Ambulatory Visit: Payer: Self-pay | Admitting: Internal Medicine

## 2024-07-06 ENCOUNTER — Other Ambulatory Visit: Payer: Self-pay | Admitting: Internal Medicine

## 2024-07-06 NOTE — Telephone Encounter (Signed)
 Called and spoke with patient. He states that he does not need a refill of Omeprazole  since he has not had any issues with reflux.

## 2024-10-01 ENCOUNTER — Other Ambulatory Visit: Payer: Self-pay | Admitting: Nurse Practitioner

## 2024-10-01 DIAGNOSIS — F17200 Nicotine dependence, unspecified, uncomplicated: Secondary | ICD-10-CM

## 2024-10-01 DIAGNOSIS — Z122 Encounter for screening for malignant neoplasm of respiratory organs: Secondary | ICD-10-CM

## 2024-10-10 ENCOUNTER — Ambulatory Visit
Admission: RE | Admit: 2024-10-10 | Discharge: 2024-10-10 | Disposition: A | Discharge status: Home / Self Care | Source: Ambulatory Visit | Attending: Nurse Practitioner | Admitting: Nurse Practitioner

## 2024-10-10 DIAGNOSIS — F17200 Nicotine dependence, unspecified, uncomplicated: Secondary | ICD-10-CM

## 2024-10-10 DIAGNOSIS — Z122 Encounter for screening for malignant neoplasm of respiratory organs: Secondary | ICD-10-CM
